# Patient Record
Sex: Male | Born: 1965 | Race: Black or African American | Hispanic: No | Smoking: Former smoker
Health system: Southern US, Community
[De-identification: ages and names within clinical notes are randomized; demographics above are authoritative.]

## PROBLEM LIST (undated history)

## (undated) DIAGNOSIS — N183 Chronic kidney disease, stage 3 unspecified: Secondary | ICD-10-CM

## (undated) DIAGNOSIS — M5416 Radiculopathy, lumbar region: Secondary | ICD-10-CM

## (undated) DIAGNOSIS — F419 Anxiety disorder, unspecified: Secondary | ICD-10-CM

## (undated) DIAGNOSIS — M1990 Unspecified osteoarthritis, unspecified site: Secondary | ICD-10-CM

## (undated) DIAGNOSIS — Z21 Asymptomatic human immunodeficiency virus [HIV] infection status: Secondary | ICD-10-CM

## (undated) DIAGNOSIS — K219 Gastro-esophageal reflux disease without esophagitis: Secondary | ICD-10-CM

## (undated) HISTORY — DX: Unspecified osteoarthritis, unspecified site: M19.90

## (undated) HISTORY — DX: Radiculopathy, lumbar region: M54.16

## (undated) HISTORY — DX: Gastro-esophageal reflux disease without esophagitis: K21.9

## (undated) HISTORY — DX: Chronic kidney disease, stage 3 unspecified: N18.30

## (undated) HISTORY — DX: Anxiety disorder, unspecified: F41.9

---

## 2009-07-16 LAB — HM HEPATITIS C SCREENING LAB: HM Hepatitis Screen: NEGATIVE

## 2013-04-11 DIAGNOSIS — N183 Chronic kidney disease, stage 3 unspecified: Secondary | ICD-10-CM | POA: Insufficient documentation

## 2014-10-29 DIAGNOSIS — R0602 Shortness of breath: Secondary | ICD-10-CM | POA: Insufficient documentation

## 2014-10-29 DIAGNOSIS — R0981 Nasal congestion: Secondary | ICD-10-CM | POA: Insufficient documentation

## 2014-11-18 LAB — HM HIV SCREENING LAB: HM HIV Screening: POSITIVE

## 2014-11-18 LAB — HIV ANTIBODY (ROUTINE TESTING W REFLEX): HIV 1&2 Ab, 4th Generation: POSITIVE

## 2015-05-04 ENCOUNTER — Emergency Department (INDEPENDENT_AMBULATORY_CARE_PROVIDER_SITE_OTHER): Payer: Medicare Other

## 2015-05-04 ENCOUNTER — Encounter: Payer: Self-pay | Admitting: Emergency Medicine

## 2015-05-04 ENCOUNTER — Emergency Department (INDEPENDENT_AMBULATORY_CARE_PROVIDER_SITE_OTHER)
Admission: EM | Admit: 2015-05-04 | Discharge: 2015-05-04 | Disposition: A | Discharge status: Home / Self Care | Payer: Medicare Other | Source: Home / Self Care | Attending: Family Medicine | Admitting: Family Medicine

## 2015-05-04 DIAGNOSIS — M546 Pain in thoracic spine: Secondary | ICD-10-CM | POA: Diagnosis not present

## 2015-05-04 DIAGNOSIS — R918 Other nonspecific abnormal finding of lung field: Secondary | ICD-10-CM

## 2015-05-04 HISTORY — DX: Asymptomatic human immunodeficiency virus (hiv) infection status: Z21

## 2015-05-04 NOTE — ED Provider Notes (Signed)
CSN: [id number]     Arrival date & time 05/04/15  1248 History   First MD Initiated Contact with Patient 05/04/15 1351     Chief Complaint  Patient presents with  . Chest Pain      HPI Comments: Patient complains of two day history of pain in left posterior ribs. He is concerned about possible pneumonia, although he does not have a cough. No chest injury.  No rash. No fevers, chills, and sweats.  Patient is a 50 y.o. male presenting with chest pain. The history is provided by the patient.  Chest Pain Chest pain location: Left posterior ribs. Pain quality: aching   Pain radiates to:  Does not radiate Pain severity:  Mild Onset quality:  Gradual Duration:  2 days Timing:  Constant Progression:  Unchanged Chronicity:  New Context: breathing and movement   Relieved by:  Nothing Worsened by:  Coughing, certain positions and movement Ineffective treatments:  None tried Associated symptoms: back pain and shortness of breath   Associated symptoms: no abdominal pain, no cough, no diaphoresis, no fatigue, no fever and no nausea     Past Medical History  Diagnosis Date  . HIV positive (HCC)    History reviewed. No pertinent past surgical history. Family History  Problem Relation Age of Onset  . Hypertension Mother   . Heart failure Brother   . Hypertension Brother   . Stroke Brother    Social History  Substance Use Topics  . Smoking status: Current Every Day Smoker  . Smokeless tobacco: None  . Alcohol Use: Yes    Review of Systems  Constitutional: Negative for fever, diaphoresis and fatigue.  Respiratory: Positive for shortness of breath. Negative for cough.   Cardiovascular: Positive for chest pain.  Gastrointestinal: Negative for nausea and abdominal pain.  Musculoskeletal: Positive for back pain.    Allergies  Review of patient's allergies indicates no known allergies.  Home Medications   Prior to Admission medications   Medication Sig Start Date End Date Taking?  Authorizing Provider  abacavir-dolutegravir-lamiVUDine (TRIUMEQ) 600-50-300 MG tablet Take 1 tablet by mouth daily.   Yes Historical Provider, MD   Meds Ordered and Administered this Visit  Medications - No data to display  BP 151/84 mmHg  Pulse 73  Temp(Src) 98.1 F (36.7 C) (Oral)  Resp 16  Ht  (1.702 m)  Wt 170 lb (77.111 kg)  BMI 26.62 kg/m2  SpO2 99% No data found.   Physical Exam Nursing notes and Vital Signs reviewed. Appearance:  Patient appears stated age, and in no acute distress Eyes:  Pupils are equal, round, and reactive to light and accomodation.  Extraocular movement is intact.  Conjunctivae are not inflamed  Pharynx:  Normal Neck:  Supple.  No adenopathy Lungs:  Clear to auscultation.  Breath sounds are equal.  Moving air well. Heart:  Regular rate and rhythm without murmurs, rubs, or gallops.  Abdomen:  Nontender without masses or hepatosplenomegaly.  Bowel sounds are present.  No CVA or flank tenderness.  Back:  No tenderness to palpation.  No upper back or scapular tenderness to palpation  Extremities:  No edema.  Left shoulder has full range of motion. Skin:  No rash present.   ED Course  Procedures  None  Imaging Review Dg Chest 2 View  05/04/2015  CLINICAL DATA:  Pt states he has had left sided back pain x 2 days with a cough and intermittent fever. Hx of HIV EXAM: CHEST  2 VIEW COMPARISON:  None. FINDINGS: Normal cardiac silhouette. There is a new 19 mm rounded density superior to the RIGHT hilum. No pulmonary infiltrate. No pleural fluid. No pneumothorax. No aggressive osseous lesion. IMPRESSION: 1. No acute cardiopulmonary findings. 2. Smoothly marginated rounded lesion projecting superior to the RIGHT hilum. This is typical location for the azygos vein however this greater in diameter than typical. Differential would include healed rib fracture versus pulmonary nodule. Without comparison available, recommend non emergent CT thorax without contrast.  Electronically Signed   By: Genevive Bi M.D.   On: 05/04/2015 13:30      MDM   1. Left-sided thoracic back pain; probably musculoskeletal     Recommend taking Ibuprofen , 4 tabs every 8 hours with food, for 3 to 4 days until improved. Note abnormal chest X-ray. Followup with PCP to schedule CT thorax without contrast as recommended by radiologist.    Lattie Haw, MD 05/08/15 [phone]

## 2015-05-04 NOTE — ED Notes (Signed)
Reports about 2 days of pain in posterior left ribs that he thinks may be pneumonia; has been treating himself with otc cold products.

## 2015-05-04 NOTE — Discharge Instructions (Signed)
Recommend taking Ibuprofen , 4 tabs every 8 hours with food, for 3 to 4 days until improved.   Musculoskeletal Pain Musculoskeletal pain is muscle and boney aches and pains. These pains can occur in any part of the body. Your caregiver may treat you without knowing the cause of the pain. They may treat you if blood or urine tests, X-rays, and other tests were normal.  CAUSES There is often not a definite cause or reason for these pains. These pains may be caused by a type of germ (virus). The discomfort may also come from overuse. Overuse includes working out too hard when your body is not fit. Boney aches also come from weather changes. Bone is sensitive to atmospheric pressure changes. HOME CARE INSTRUCTIONS   Ask when your test results will be ready. Make sure you get your test results.  Only take over-the-counter or prescription medicines for pain, discomfort, or fever as directed by your caregiver. If you were given medications for your condition, do not drive, operate machinery or power tools, or sign legal documents for 24 hours. Do not drink alcohol. Do not take sleeping pills or other medications that may interfere with treatment.  Continue all activities unless the activities cause more pain. When the pain lessens, slowly resume normal activities. Gradually increase the intensity and duration of the activities or exercise.  During periods of severe pain, bed rest may be helpful. Lay or sit in any position that is comfortable.  Putting ice on the injured area.  Put ice in a bag.  Place a towel between your skin and the bag.  Leave the ice on for 15 to 20 minutes, 3 to 4 times a day.  Follow up with your caregiver for continued problems and no reason can be found for the pain. If the pain becomes worse or does not go away, it may be necessary to repeat tests or do additional testing. Your caregiver may need to look further for a possible cause. SEEK IMMEDIATE MEDICAL CARE  IF:  You have pain that is getting worse and is not relieved by medications.  You develop chest pain that is associated with shortness or breath, sweating, feeling sick to your stomach (nauseous), or throw up (vomit).  Your pain becomes localized to the abdomen.  You develop any new symptoms that seem different or that concern you. MAKE SURE YOU:   Understand these instructions.  Will watch your condition.  Will get help right away if you are not doing well or get worse.   This information is not intended to replace advice given to you by your health care provider. Make sure you discuss any questions you have with your health care provider.   Document Released: 03/16/2005 Document Revised: 06/08/2011 Document Reviewed: 11/18/2012 Elsevier Interactive Patient Education [employer]

## 2015-05-09 ENCOUNTER — Telehealth: Payer: Self-pay | Admitting: *Deleted

## 2015-05-28 DIAGNOSIS — R300 Dysuria: Secondary | ICD-10-CM | POA: Insufficient documentation

## 2016-02-27 DIAGNOSIS — R7689 Other specified abnormal immunological findings in serum: Secondary | ICD-10-CM | POA: Insufficient documentation

## 2016-02-27 DIAGNOSIS — R768 Other specified abnormal immunological findings in serum: Secondary | ICD-10-CM | POA: Insufficient documentation

## 2018-02-07 ENCOUNTER — Emergency Department (INDEPENDENT_AMBULATORY_CARE_PROVIDER_SITE_OTHER)
Admission: EM | Admit: 2018-02-07 | Discharge: 2018-02-07 | Disposition: A | Discharge status: Home / Self Care | Payer: Medicare Other | Source: Home / Self Care | Attending: Family Medicine | Admitting: Family Medicine

## 2018-02-07 ENCOUNTER — Other Ambulatory Visit: Payer: Self-pay

## 2018-02-07 DIAGNOSIS — R197 Diarrhea, unspecified: Secondary | ICD-10-CM | POA: Diagnosis not present

## 2018-02-07 DIAGNOSIS — R51 Headache: Secondary | ICD-10-CM

## 2018-02-07 DIAGNOSIS — R519 Headache, unspecified: Secondary | ICD-10-CM

## 2018-02-07 LAB — POCT CBC W AUTO DIFF (K'VILLE URGENT CARE)

## 2018-02-07 NOTE — ED Triage Notes (Signed)
Pt has not been feeling well for about 1 week.  Diarrhea all last week, better,but not resolved.  Has had a headache for the last 2-3 days, ibuprofen helps, but not resolved.

## 2018-02-07 NOTE — Discharge Instructions (Signed)
°  Your symptoms are likely due to a stomach virus. It is recommended that you stay well hydrated and get plenty of rest. It is important to replenish your electrolytes from the diarrhea by drinking sports drinks such as Gatorade or drinking Pedialyte.     Avoid fried fatty food, spicy food, and milk as these foods can cause worsening stomach upset.  Bread, rice, apple sauce, and toast are good foods to help your stool bulk up. There are additional food items/dietary recommendations for diarrhea in this packet.  Please follow up with family medicine or your infectious disease doctor in 3-4 days if not improving, sooner if worsening.

## 2018-02-07 NOTE — ED Provider Notes (Signed)
[facility]    CSN: [id number] Arrival date & time: 02/07/18  0804     History   Chief Complaint Chief Complaint  Patient presents with  . Diarrhea  . Headache    HPI Louis Bennett is a 52 y.o. male.   HPI Louis Bennett is a 52 y.o. male presenting to UC with c/o not feeling well for about 1 week with initial body aches, cough, congestion, that has since passed but he has had a week for watery diarrhea as well.  The diarrhea has gradually improved with just 1 episode yesterday and none today. Pt c/o generalized HA for 2-3 days, nagging, mild to moderate in severity. Temporary relief with ibuprofen. Denies fever. No known sick contacts or recent travel. He is unsure of his CD4 count. Medical records show his CD4 count was 0.30 on 11/23/17 and HIV RNA Quant was <20 target not detected copies. He is compliant with his HIV medications and f/u with ID routinely.     Past Medical History:  Diagnosis Date  . HIV positive (HCC)     There are no active problems to display for this patient.   History reviewed. No pertinent surgical history.     Home Medications    Prior to Admission medications   Medication Sig Start Date End Date Taking? Authorizing Provider  abacavir-dolutegravir-lamiVUDine (TRIUMEQ) 600-50-300 MG tablet Take 1 tablet by mouth daily.    [provider]    Family History Family History  Problem Relation Age of Onset  . Hypertension Mother   . Heart failure Brother   . Hypertension Brother   . Stroke Brother     Social History Social History   Tobacco Use  . Smoking status: Current Every Day Smoker  Substance Use Topics  . Alcohol use: Yes  . Drug use: Not on file     Allergies   Patient has no known allergies.   Review of Systems Review of Systems  Constitutional: Negative for chills and fever.  Gastrointestinal: Positive for abdominal pain (mild diffuse cramping), diarrhea, nausea and vomiting. Negative for  blood in stool and constipation.  Musculoskeletal: Negative for arthralgias, back pain and myalgias.  Neurological: Positive for headaches. Negative for dizziness and light-headedness.     Physical Exam Triage Vital Signs ED Triage Vitals  Enc Vitals Group     BP 02/07/18 0823 (!) 143/89     Pulse Rate 02/07/18 0823 70     Resp 02/07/18 0823 18     Temp 02/07/18 0823 98.1 F (36.7 C)     Temp Source 02/07/18 0823 Oral     SpO2 02/07/18 0823 100 %     Weight 02/07/18 0824 175 lb (79.4 kg)     Height 02/07/18 0824 5\' 7"  (1.702 m)     Head Circumference --      Peak Flow --      Pain Score 02/07/18 0824 4     Pain Loc --      Pain Edu? --      Excl. in GC? --    No data found.  Updated Vital Signs BP (!) 143/89 (BP Location: Right Arm)   Pulse 70   Temp 98.1 F (36.7 C) (Oral)   Resp 18   Ht 5\' 7"  (1.702 m)   Wt 175 lb (79.4 kg)   SpO2 100%   BMI 27.41 kg/m   Visual Acuity Right Eye Distance:   Left Eye Distance:   Bilateral Distance:  Right Eye Near:   Left Eye Near:    Bilateral Near:     Physical Exam  Constitutional: He is oriented to person, place, and time. He appears well-developed and well-nourished.  Non-toxic appearance. He does not appear ill. No distress.  HENT:  Head: Normocephalic and atraumatic.  Right Ear: Tympanic membrane normal.  Left Ear: Tympanic membrane normal.  Nose: Nose normal. Right sinus exhibits no maxillary sinus tenderness and no frontal sinus tenderness. Left sinus exhibits no maxillary sinus tenderness and no frontal sinus tenderness.  Mouth/Throat: Uvula is midline, oropharynx is clear and moist and mucous membranes are normal.  Eyes: Pupils are equal, round, and reactive to light. EOM are normal.  Neck: Normal range of motion. Neck supple.  Cardiovascular: Normal rate and regular rhythm.  Pulmonary/Chest: Effort normal and breath sounds normal. No stridor. No respiratory distress. He has no wheezes.  Abdominal: Soft. He  exhibits no distension. There is no tenderness.  Musculoskeletal: Normal range of motion.  Neurological: He is alert and oriented to person, place, and time. GCS eye subscore is 4. GCS verbal subscore is 5. GCS motor subscore is 6.  Skin: Skin is warm and dry. Capillary refill takes less than 2 seconds.  Psychiatric: He has a normal mood and affect. His behavior is normal.  Nursing note and vitals reviewed.    UC Treatments / Results  Labs (all labs ordered are listed, but only abnormal results are displayed) Labs Reviewed  COMPLETE METABOLIC PANEL WITH GFR  POCT CBC W AUTO DIFF ([facility])    EKG None  Radiology No results found.  Procedures Procedures (including critical care time)  Medications Ordered in UC Medications - No data to display  Initial Impression / Assessment and Plan / UC Course  I have reviewed the triage vital signs and the nursing notes.  Pertinent labs & imaging results that were available during my care of the patient were reviewed by me and considered in my medical decision making (see chart for details).     Normal exam. Hx c/w viral illness Encouraged increased fluids and rest, bland diet. CBC: unremarkable CMP: pending Encouraged f/u with PCP or ID later this week if not improving.  Final Clinical Impressions(s) / UC Diagnoses   Final diagnoses:  Diarrhea, unspecified type  Generalized headache     Discharge Instructions      Your symptoms are likely due to a stomach virus. It is recommended that you stay well hydrated and get plenty of rest. It is important to replenish your electrolytes from the diarrhea by drinking sports drinks such as Gatorade or drinking Pedialyte.     Avoid fried fatty food, spicy food, and milk as these foods can cause worsening stomach upset.  Bread, rice, apple sauce, and toast are good foods to help your stool bulk up. There are additional food items/dietary recommendations for diarrhea in this  packet.  Please follow up with family medicine or your infectious disease doctor in 3-4 days if not improving, sooner if worsening.      ED Prescriptions    None     Controlled Substance Prescriptions [place] Controlled Substance Registry consulted? Not Applicable   Lurene Shadow, PA-C 02/07/18 1011

## 2018-02-08 ENCOUNTER — Telehealth: Payer: Self-pay

## 2018-02-08 LAB — COMPLETE METABOLIC PANEL WITH GFR
AG Ratio: 1.8 (calc) (ref 1.0–2.5)
ALT: 28 U/L (ref 9–46)
AST: 19 U/L (ref 10–35)
Albumin: 4.4 g/dL (ref 3.6–5.1)
Alkaline phosphatase (APISO): 46 U/L (ref 40–115)
BUN: 14 mg/dL (ref 7–25)
CO2: 28 mmol/L (ref 20–32)
Calcium: 9.3 mg/dL (ref 8.6–10.3)
Chloride: 105 mmol/L (ref 98–110)
Creat: 1.2 mg/dL (ref 0.70–1.33)
GFR, Est African American: 79 mL/min/{1.73_m2} (ref >=60)
GFR, Est Non African American: 68 mL/min/{1.73_m2} (ref >=60)
Globulin: 2.4 g/dL (calc) (ref 1.9–3.7)
Glucose, Bld: 102 mg/dL — ABNORMAL HIGH (ref 65–99)
Potassium: 4 mmol/L (ref 3.5–5.3)
Sodium: 141 mmol/L (ref 135–146)
Total Bilirubin: 0.4 mg/dL (ref 0.2–1.2)
Total Protein: 6.8 g/dL (ref 6.1–8.1)

## 2018-02-08 NOTE — Telephone Encounter (Signed)
Feeling better, lab results given, and contact information given.

## 2018-03-14 DIAGNOSIS — R12 Heartburn: Secondary | ICD-10-CM | POA: Insufficient documentation

## 2019-09-28 ENCOUNTER — Ambulatory Visit: Payer: Medicare Other | Admitting: Family Medicine

## 2019-09-28 ENCOUNTER — Ambulatory Visit (INDEPENDENT_AMBULATORY_CARE_PROVIDER_SITE_OTHER): Payer: Medicare Other | Admitting: Medical-Surgical

## 2019-09-28 ENCOUNTER — Encounter: Payer: Self-pay | Admitting: Medical-Surgical

## 2019-09-28 VITALS — BP 120/85 | HR 78 | Temp 98.3°F | Ht 67.0 in | Wt 176.7 lb

## 2019-09-28 DIAGNOSIS — Z7689 Persons encountering health services in other specified circumstances: Secondary | ICD-10-CM

## 2019-09-28 DIAGNOSIS — B20 Human immunodeficiency virus [HIV] disease: Secondary | ICD-10-CM | POA: Insufficient documentation

## 2019-09-28 DIAGNOSIS — N183 Chronic kidney disease, stage 3 unspecified: Secondary | ICD-10-CM

## 2019-09-28 DIAGNOSIS — E785 Hyperlipidemia, unspecified: Secondary | ICD-10-CM | POA: Diagnosis not present

## 2019-09-28 DIAGNOSIS — Z21 Asymptomatic human immunodeficiency virus [HIV] infection status: Secondary | ICD-10-CM | POA: Insufficient documentation

## 2019-09-28 NOTE — Progress Notes (Signed)
New Patient Office Visit  Subjective:  Patient ID: Louis Bennett, male    DOB: 12/17/1965  Age: 54 y.o. MRN: [id number]  CC:  Chief Complaint  Patient presents with  . Establish Care    HPI Louis Bennett is a pleasant 54 year old male who presents today to establish care.  Up until recently, he has sought most of his care with infectious disease but he reports that he was instructed by his providers there that he needs to establish a PCP.  His chronic health conditions include HIV positive, hyperlipidemia, CKD stage III, and HSV-2 seropositive.  His HIV is stable and is managed and monitored by infectious disease.  He is not currently on any cholesterol medicines but he reports trying to live a healthy life and exercising regularly.  He has no concerns today.  Past Medical History:  Diagnosis Date  . HIV positive (HCC)     History reviewed. No pertinent surgical history.  Family History  Problem Relation Age of Onset  . Hypertension Mother   . Diabetes Mother   . Heart failure Brother   . Hypertension Brother   . Stroke Brother     Social History   Socioeconomic History  . Marital status: Single    Spouse name: Not on file  . Number of children: Not on file  . Years of education: Not on file  . Highest education level: Not on file  Occupational History  . Not on file  Tobacco Use  . Smoking status: Current Some Day Smoker    Types: Cigarettes  . Smokeless tobacco: Never Used  Vaping Use  . Vaping Use: Some days  . Substances: Nicotine  Substance and Sexual Activity  . Alcohol use: Yes    Alcohol/week: 12.0 standard drinks    Types: 12 Cans of beer per week    Comment: only drinks on weekends  . Drug use: Never  . Sexual activity: Yes    Partners: Female    Birth control/protection: Condom  Other Topics Concern  . Not on file  Social History Narrative  . Not on file   Social Determinants of Health   Financial Resource Strain:   . Difficulty of  Paying Living Expenses:   Food Insecurity:   . Worried About Programme researcher, broadcasting/film/video in the Last Year:   . Barista in the Last Year:   Transportation Needs:   . Freight forwarder (Medical):   Marland Kitchen Lack of Transportation (Non-Medical):   Physical Activity:   . Days of Exercise per Week:   . Minutes of Exercise per Session:   Stress:   . Feeling of Stress :   Social Connections:   . Frequency of Communication with Friends and Family:   . Frequency of Social Gatherings with Friends and Family:   . Attends Religious Services:   . Active Member of Clubs or Organizations:   . Attends Banker Meetings:   Marland Kitchen Marital Status:   Intimate Partner Violence:   . Fear of Current or Ex-Partner:   . Emotionally Abused:   Marland Kitchen Physically Abused:   . Sexually Abused:     ROS Review of Systems  Constitutional: Negative for chills, fatigue, fever and unexpected weight change.  HENT: Negative for congestion, sneezing and sore throat.   Eyes: Negative for visual disturbance.  Respiratory: Negative for cough, chest tightness, shortness of breath and wheezing.   Cardiovascular: Negative for chest pain, palpitations and leg swelling.  Gastrointestinal: Negative for abdominal  pain, nausea and vomiting.  Genitourinary: Negative for dysuria.  Neurological: Negative for dizziness, light-headedness and headaches.  Psychiatric/Behavioral: Negative for dysphoric mood, self-injury, sleep disturbance and suicidal ideas. The patient is not nervous/anxious.     Objective:   Today's Vitals: BP 120/85   Pulse 78   Temp 98.3 F (36.8 C) (Oral)   Ht 5\' 7"  (1.702 m)   Wt 176 lb 11.2 oz (80.2 kg)   SpO2 96%   BMI 27.68 kg/m   Physical Exam Vitals reviewed.  Constitutional:      General: He is not in acute distress.    Appearance: Normal appearance. He is normal weight.  HENT:     Head: Normocephalic and atraumatic.  Cardiovascular:     Rate and Rhythm: Normal rate and regular rhythm.      Pulses: Normal pulses.     Heart sounds: Normal heart sounds. No murmur heard.  No friction rub. No gallop.   Pulmonary:     Effort: Pulmonary effort is normal. No respiratory distress.     Breath sounds: Normal breath sounds. No wheezing.  Skin:    General: Skin is warm and dry.  Neurological:     Mental Status: He is alert and oriented to person, place, and time.  Psychiatric:        Mood and Affect: Mood normal.        Behavior: Behavior normal.        Thought Content: Thought content normal.        Judgment: Judgment normal.     Assessment & Plan:   1. Human immunodeficiency virus (HIV) disease (HCC) Managed by infectious disease at [facility].  2. Stage 3 chronic kidney disease, unspecified whether stage 3a or 3b CKD Per last check, GFR at 61.  Stable.  3. Hyperlipidemia, unspecified hyperlipidemia type Continue lifestyle modifications to include low fat heart healthy diet and regular exercise.  Last lipids checked in December 2019 but he has blood drawn every 6 months with infectious disease.  He would like to wait to see if they will draw a lipid panel for him at his next blood draw.  4.  Encounter to establish care  Reviewed records available in care everywhere.  Discussed medical concerns and history with patient.  He is due for colonoscopy but for now would like to postpone.    Outpatient Encounter Medications as of 09/28/2019  Medication Sig  . abacavir-dolutegravir-lamiVUDine (TRIUMEQ) 600-50-300 MG tablet Take 1 tablet by mouth daily.   No facility-administered encounter medications on file as of 09/28/2019.    Follow-up: Return in about 1 year (around 09/27/2020) for annual physical exam or sooner if needed.   11/28/2020, DNP, APRN, FNP-BC [facility] and Sports Medicine

## 2019-12-12 ENCOUNTER — Other Ambulatory Visit: Payer: Self-pay

## 2019-12-12 ENCOUNTER — Ambulatory Visit (INDEPENDENT_AMBULATORY_CARE_PROVIDER_SITE_OTHER): Payer: Medicare Other | Admitting: Sports Medicine

## 2019-12-12 ENCOUNTER — Ambulatory Visit (INDEPENDENT_AMBULATORY_CARE_PROVIDER_SITE_OTHER): Payer: Medicare Other

## 2019-12-12 DIAGNOSIS — M5416 Radiculopathy, lumbar region: Secondary | ICD-10-CM

## 2019-12-12 DIAGNOSIS — R0602 Shortness of breath: Secondary | ICD-10-CM

## 2019-12-12 LAB — CBC
HCT: 39.8 % (ref 38.5–50.0)
Hemoglobin: 13.4 g/dL (ref 13.2–17.1)
MCH: 31.2 pg (ref 27.0–33.0)
MCHC: 33.7 g/dL (ref 32.0–36.0)
MCV: 92.6 fL (ref 80.0–100.0)
MPV: 9.7 fL (ref 7.5–12.5)
Platelets: 216 10*3/uL (ref 140–400)
RBC: 4.3 10*6/uL (ref 4.20–5.80)
RDW: 13 % (ref 11.0–15.0)
WBC: 4.3 10*3/uL (ref 3.8–10.8)

## 2019-12-12 LAB — COMPLETE METABOLIC PANEL WITH GFR
AG Ratio: 1.7 (calc) (ref 1.0–2.5)
ALT: 29 U/L (ref 9–46)
AST: 20 U/L (ref 10–35)
Albumin: 4.5 g/dL (ref 3.6–5.1)
Alkaline phosphatase (APISO): 44 U/L (ref 35–144)
BUN/Creatinine Ratio: 18 (calc) (ref 6–22)
BUN: 25 mg/dL (ref 7–25)
CO2: 25 mmol/L (ref 20–32)
Calcium: 9.2 mg/dL (ref 8.6–10.3)
Chloride: 107 mmol/L (ref 98–110)
Creat: 1.4 mg/dL — ABNORMAL HIGH (ref 0.70–1.33)
GFR, Est African American: 65 mL/min/{1.73_m2} (ref >=60)
GFR, Est Non African American: 56 mL/min/{1.73_m2} — ABNORMAL LOW (ref >=60)
Globulin: 2.6 g/dL (calc) (ref 1.9–3.7)
Glucose, Bld: 89 mg/dL (ref 65–139)
Potassium: 4.5 mmol/L (ref 3.5–5.3)
Sodium: 140 mmol/L (ref 135–146)
Total Bilirubin: 0.4 mg/dL (ref 0.2–1.2)
Total Protein: 7.1 g/dL (ref 6.1–8.1)

## 2019-12-12 LAB — D-DIMER, QUANTITATIVE: D-Dimer, Quant: 0.23 mcg/mL FEU (ref <0.50)

## 2019-12-12 MED ORDER — MELOXICAM 15 MG PO TABS: ORAL_TABLET | ORAL | 3 refills | Status: DC

## 2019-12-12 NOTE — Assessment & Plan Note (Signed)
Louis Bennett is also, adding of some shortness of breath with deep breathing, due to pleuritic type chest pain I am going to add a D-dimer, CBC, CMP, chest x-ray. If nothing acute then he can follow this up with his PCP. He does currently smoke about 2 cigarettes a day.

## 2019-12-12 NOTE — Assessment & Plan Note (Signed)
This is a pleasant 54 year old male, for the past several weeks he has had pain in his low back with radiation down the right leg, anterolateral aspect. He was seen in the ED, no imaging done, he was given meloxicam which seem to be helpful. No progressive weakness, bowel or bladder function of saddle numbness, constitutional symptoms. We will start with formal physical therapy, x-rays, refilling meloxicam. Return to see me in 4 to 6 weeks, MRI for interventional planning if no better.

## 2019-12-12 NOTE — Progress Notes (Signed)
    Procedures performed today:    None.  Independent interpretation of notes and tests performed by another provider:   None.  Brief History, Exam, Impression, and Recommendations:    Louis Bennett is a pleasant 54yo male who presents today with bilateral lower back pain. It has been going on for about 3 weeks. He said it started after working out. He has pain and tingling that radiates down the right leg mainly. He has no weakness in his legs or loss of bowel/bladder function. He was seen in the ED a week ago and was given meloxicam which helped. This is potentially a herniated disk in his back. We are going to get a lumbar X-ray and prescribe PT and meloxicam for pain managament. He can follow up in 4-6 weeks for reevaulaiton.   He also complains of some SOB that has been ongoing for a month. He smokes a couple cigarettes a day. On exam no aortic widening was appreciated. We are going to get a D-dimer today to r/o clot. We are also going to get  A chest xray today. He can follow up in 4-6 weeks for reevaluation.  Aurelio Jew, MS3   ___________________________________________ Louis Bennett. Benjamin Stain, M.D., ABFM., CAQSM. Primary Care and Sports Medicine [facility]  Adjunct Instructor of Family Medicine  [employer]

## 2019-12-13 ENCOUNTER — Telehealth: Payer: Self-pay

## 2019-12-13 DIAGNOSIS — R911 Solitary pulmonary nodule: Secondary | ICD-10-CM

## 2019-12-13 DIAGNOSIS — F17210 Nicotine dependence, cigarettes, uncomplicated: Secondary | ICD-10-CM

## 2019-12-13 NOTE — Telephone Encounter (Signed)
Received call report on patients CXR done today.   Reported printed and given to Dr Ashley Royalty, OK given to send note to PCP to further address tomorrow.   Joy, please see report in chart

## 2019-12-14 NOTE — Telephone Encounter (Signed)
Task completed. Left a detailed vm msg for patient regarding CT Scan. Imaging contact info provided to patient. Direct call back info provided.

## 2019-12-14 NOTE — Telephone Encounter (Signed)
Pt aware of results, CT order, and that Imaging will be calling him for scheduling. No further questions or concerns at this time.

## 2019-12-14 NOTE — Telephone Encounter (Signed)
Low dose noncontrast CT ordered for further evaluation of lung nodule seen on CXR yesterday. Please contact patient to let him know they will reach out to him to get scheduled.

## 2019-12-21 ENCOUNTER — Ambulatory Visit (INDEPENDENT_AMBULATORY_CARE_PROVIDER_SITE_OTHER): Payer: Medicare Other

## 2019-12-21 ENCOUNTER — Other Ambulatory Visit: Payer: Self-pay

## 2019-12-21 DIAGNOSIS — R911 Solitary pulmonary nodule: Secondary | ICD-10-CM | POA: Diagnosis not present

## 2019-12-21 DIAGNOSIS — F17210 Nicotine dependence, cigarettes, uncomplicated: Secondary | ICD-10-CM

## 2020-01-11 ENCOUNTER — Encounter: Payer: Self-pay | Admitting: Medical-Surgical

## 2020-01-11 ENCOUNTER — Ambulatory Visit (INDEPENDENT_AMBULATORY_CARE_PROVIDER_SITE_OTHER): Payer: Medicare Other

## 2020-01-11 ENCOUNTER — Other Ambulatory Visit: Payer: Self-pay

## 2020-01-11 ENCOUNTER — Ambulatory Visit (INDEPENDENT_AMBULATORY_CARE_PROVIDER_SITE_OTHER): Payer: Medicare Other | Admitting: Medical-Surgical

## 2020-01-11 VITALS — BP 132/82 | HR 85 | Temp 97.8°F | Ht 67.0 in | Wt 182.6 lb

## 2020-01-11 DIAGNOSIS — R0600 Dyspnea, unspecified: Secondary | ICD-10-CM

## 2020-01-11 DIAGNOSIS — Z23 Encounter for immunization: Secondary | ICD-10-CM | POA: Diagnosis not present

## 2020-01-11 DIAGNOSIS — R6889 Other general symptoms and signs: Secondary | ICD-10-CM

## 2020-01-11 DIAGNOSIS — R5383 Other fatigue: Secondary | ICD-10-CM

## 2020-01-11 DIAGNOSIS — R0609 Other forms of dyspnea: Secondary | ICD-10-CM

## 2020-01-11 DIAGNOSIS — R002 Palpitations: Secondary | ICD-10-CM

## 2020-01-11 DIAGNOSIS — B20 Human immunodeficiency virus [HIV] disease: Secondary | ICD-10-CM

## 2020-01-11 LAB — CBC WITH DIFFERENTIAL/PLATELET
Basophils Relative: 0.3 %
Eosinophils Relative: 3.7 %
Lymphs Abs: 967 cells/uL (ref 850–3900)
MCH: 31.1 pg (ref 27.0–33.0)
MCHC: 34.2 g/dL (ref 32.0–36.0)
MCV: 90.9 fL (ref 80.0–100.0)
Neutro Abs: 1940 cells/uL (ref 1500–7800)
Platelets: 231 10*3/uL (ref 140–400)
RBC: 4.6 10*6/uL (ref 4.20–5.80)
RDW: 12.6 % (ref 11.0–15.0)
WBC: 3.3 10*3/uL — ABNORMAL LOW (ref 3.8–10.8)

## 2020-01-11 NOTE — Progress Notes (Signed)
Subjective:    CC: Headache, fatigue  HPI: Pleasant 55 year old HIV-positive male presenting today with complaints of generalized malaise, fatigue, and headache over the past 3 months.  His complaints are nonspecific but he feels as if his immune system is shutting down and is if there is something wrong in his body.  He is taking Triumeq as prescribed.  He is followed by infectious disease every 6 months and was last seen at the end of April with them.  They are checking his HIV and CD4 counts regularly.  His last levels were undetectable and his CD4 counts were okay.  He normally exercises several days every week but lately has been so fatigued that he does not have the energy to do so.  Endorses that he does occasionally get mildly short of breath after climbing stairs.  When lying down, he feels his pulse in the top of his head and along his forehead.  At times feels like his heart slows way down and other times it feels like his heart skips a beat.  Gets dizzy at times with positional changes.  Admits that he does not drink much water.  Notes that he does sleep well but wakes still feeling tired and has been excessively sleepy throughout the day.  He does snore intermittently but has no documented history of sleep apnea and he has not been told that he stops breathing in his sleep.  Has never had a sleep study.  Reports intermittent numbness and tingling that has affected mostly his left upper extremity but also his right lower extremity.  Denies muscle tenderness but feels like his muscles are weak.  Denies fever, chills, shortness of breath at rest, chest pain, wheezing, coughing, abdominal pain or nausea/vomiting.  STOP-BANG for SLEEP APNEA Do you Snore loudly? Yes Do you often feel Tired during day? Yes Has anyone Observed you stop breathing? No History of high blood Pressure? No BMI >35? No Age >50? Yes Neck circumference >16 in? No Gender male? Yes 5-8 = high risk 3-4 =  intermediate 0-2 = low risk   I reviewed the past medical history, family history, social history, surgical history, and allergies today and no changes were needed.  Please see the problem list section below in epic for further details.  Past Medical History: Past Medical History:  Diagnosis Date  . HIV positive (HCC)    Past Surgical History: History reviewed. No pertinent surgical history. Social History: Social History   Socioeconomic History  . Marital status: Single    Spouse name: Not on file  . Number of children: Not on file  . Years of education: Not on file  . Highest education level: Not on file  Occupational History  . Not on file  Tobacco Use  . Smoking status: Current Some Day Smoker    Types: Cigarettes  . Smokeless tobacco: Never Used  Vaping Use  . Vaping Use: Some days  . Substances: Nicotine  Substance and Sexual Activity  . Alcohol use: Yes    Alcohol/week: 12.0 standard drinks    Types: 12 Cans of beer per week    Comment: only drinks on weekends  . Drug use: Never  . Sexual activity: Yes    Partners: Female    Birth control/protection: Condom  Other Topics Concern  . Not on file  Social History Narrative  . Not on file   Social Determinants of Health   Financial Resource Strain:   . Difficulty of Paying Living Expenses: Not  on file  Food Insecurity:   . Worried About Programme researcher, broadcasting/film/video in the Last Year: Not on file  . Ran Out of Food in the Last Year: Not on file  Transportation Needs:   . Lack of Transportation (Medical): Not on file  . Lack of Transportation (Non-Medical): Not on file  Physical Activity:   . Days of Exercise per Week: Not on file  . Minutes of Exercise per Session: Not on file  Stress:   . Feeling of Stress : Not on file  Social Connections:   . Frequency of Communication with Friends and Family: Not on file  . Frequency of Social Gatherings with Friends and Family: Not on file  . Attends Religious Services: Not  on file  . Active Member of Clubs or Organizations: Not on file  . Attends Banker Meetings: Not on file  . Marital Status: Not on file   Family History: Family History  Problem Relation Age of Onset  . Hypertension Mother   . Diabetes Mother   . Heart failure Brother   . Hypertension Brother   . Stroke Brother    Allergies: No Known Allergies Medications: See med rec.  Review of Systems: See HPI for pertinent positives and negatives.   Objective:    General: Well Developed, well nourished, and in no acute distress.  Neuro: Alert and oriented x3.  HEENT: Normocephalic, atraumatic, pupils equal round reactive to light, neck supple, no masses, no lymphadenopathy, thyroid nonpalpable.  Mild erythema to bilateral external ear canals (pt uses Q-tips to clean them) but normal TMs.  Dizziness provoked with rising from lying to sitting, resolved after approximately 30 seconds Skin: Warm and dry. Cardiac: Regular rate and rhythm, no murmurs rubs or gallops, no lower extremity edema.  Respiratory: Clear to auscultation bilaterally. Not using accessory muscles, speaking in full sentences.   Impression and Recommendations:    1. Fatigue, unspecified type This generalized fatigue and general feeling of unwellness has been present for several months.  Creatinine slightly elevated last month.  We will check CBC with differential, CMP, CD4 count, HIV ultra quant, CK, TSH, vitamin B12, and testosterone today.  Suspect there may be a bit of dehydration contributing to his symptoms so recommend drinking no less than 64 ounces of water every day.  2. Palpitation With his intermittent sensations of bradycardia and possible skipped beats, getting twelve-lead EKG.  In office EKG completed without difficulty with normal rate of 72, normal rhythm, and normal axis.  No acute changes. - EKG 12-Lead  3. DOE (dyspnea on exertion) EKG showing normal sinus rhythm with no acute changes.  Chest  x-ray ordered. - EKG 12-Lead - DG Chest 2 View; Future  4. Need for influenza vaccination Flu vaccine given in office today. - Flu Vaccine QUAD 36+ mos IM  5. Human immunodeficiency virus (HIV) disease (HCC)  This is normally followed by his infectious disease doctor but he reports his next appointment is in January.  With his current symptoms, we will go ahead and check an HIV ultra quant and CD4 count.  Advised patient to contact his infectious disease office and verify his next appointment as they should be following him every 6 months. - HIV-1 RNA ultraquant reflex to gentyp+ - T-helper cells (CD4) count  6. Other general symptoms and signs  Having intermittent numbness and tingling in his extremities so we will go ahead and check vitamin B12 level. - Vitamin B12  Return for follow up pending  lab and imaging results. ___________________________________________ Thayer Ohm, DNP, APRN, FNP-BC [facility]

## 2020-01-11 NOTE — Patient Instructions (Signed)

## 2020-01-12 LAB — COMPLETE METABOLIC PANEL WITH GFR
Chloride: 103 mmol/L (ref 98–110)
Glucose, Bld: 96 mg/dL (ref 65–99)
Total Bilirubin: 0.4 mg/dL (ref 0.2–1.2)

## 2020-01-13 LAB — VITAMIN B12: Vitamin B-12: 471 pg/mL (ref 200–1100)

## 2020-01-13 LAB — T-HELPER CELLS (CD4) COUNT (NOT AT ARMC)
Absolute CD4: 380 cells/uL — ABNORMAL LOW (ref 490–1740)
CD4 T Helper %: 40 % (ref 30–61)
Total lymphocyte count: 940 cells/uL (ref 850–3900)

## 2020-01-13 LAB — HIV-1 RNA ULTRAQUANT REFLEX TO GENTYP+
HIV 1 RNA Quant: <20 Copies/mL — ABNORMAL HIGH
HIV-1 RNA Quant, Log: <1.3 Log cps/mL — ABNORMAL HIGH

## 2020-01-13 LAB — COMPLETE METABOLIC PANEL WITH GFR
AG Ratio: 1.6 (calc) (ref 1.0–2.5)
ALT: 29 U/L (ref 9–46)
AST: 20 U/L (ref 10–35)
Albumin: 4.5 g/dL (ref 3.6–5.1)
Alkaline phosphatase (APISO): 48 U/L (ref 35–144)
BUN: 25 mg/dL (ref 7–25)
CO2: 28 mmol/L (ref 20–32)
Calcium: 9.5 mg/dL (ref 8.6–10.3)
Creat: 1.33 mg/dL (ref 0.70–1.33)
GFR, Est African American: 69 mL/min/{1.73_m2} (ref >=60)
GFR, Est Non African American: 59 mL/min/{1.73_m2} — ABNORMAL LOW (ref >=60)
Globulin: 2.8 g/dL (calc) (ref 1.9–3.7)
Potassium: 4.5 mmol/L (ref 3.5–5.3)
Sodium: 139 mmol/L (ref 135–146)
Total Protein: 7.3 g/dL (ref 6.1–8.1)

## 2020-01-13 LAB — TESTOSTERONE: Testosterone: 365 ng/dL (ref 250–827)

## 2020-01-13 LAB — CBC WITH DIFFERENTIAL/PLATELET
Absolute Monocytes: 261 cells/uL (ref 200–950)
Basophils Absolute: 10 cells/uL (ref 0–200)
Eosinophils Absolute: 122 cells/uL (ref 15–500)
HCT: 41.8 % (ref 38.5–50.0)
Hemoglobin: 14.3 g/dL (ref 13.2–17.1)
MPV: 9.8 fL (ref 7.5–12.5)
Monocytes Relative: 7.9 %
Neutrophils Relative %: 58.8 %
Total Lymphocyte: 29.3 %

## 2020-01-13 LAB — CK: Total CK: 145 U/L (ref 44–196)

## 2020-01-13 LAB — TSH: TSH: 0.96 mIU/L (ref 0.40–4.50)

## 2020-03-27 DIAGNOSIS — J1282 Pneumonia due to coronavirus disease 2019: Secondary | ICD-10-CM

## 2020-03-27 DIAGNOSIS — U071 COVID-19: Secondary | ICD-10-CM

## 2020-03-27 HISTORY — DX: Pneumonia due to coronavirus disease 2019: J12.82

## 2020-03-27 HISTORY — DX: COVID-19: U07.1

## 2020-03-29 DIAGNOSIS — J9601 Acute respiratory failure with hypoxia: Secondary | ICD-10-CM | POA: Insufficient documentation

## 2020-03-29 HISTORY — DX: Acute respiratory failure with hypoxia: J96.01

## 2020-04-18 DIAGNOSIS — B59 Pneumocystosis: Secondary | ICD-10-CM | POA: Insufficient documentation

## 2020-04-19 ENCOUNTER — Ambulatory Visit (INDEPENDENT_AMBULATORY_CARE_PROVIDER_SITE_OTHER): Payer: Medicare Other

## 2020-04-19 ENCOUNTER — Encounter: Payer: Self-pay | Admitting: Medical-Surgical

## 2020-04-19 ENCOUNTER — Telehealth (INDEPENDENT_AMBULATORY_CARE_PROVIDER_SITE_OTHER): Payer: Medicare Other | Admitting: Medical-Surgical

## 2020-04-19 ENCOUNTER — Other Ambulatory Visit: Payer: Self-pay | Admitting: Interventional Radiology

## 2020-04-19 ENCOUNTER — Other Ambulatory Visit: Payer: Self-pay

## 2020-04-19 DIAGNOSIS — J1282 Pneumonia due to coronavirus disease 2019: Secondary | ICD-10-CM

## 2020-04-19 DIAGNOSIS — Z09 Encounter for follow-up examination after completed treatment for conditions other than malignant neoplasm: Secondary | ICD-10-CM

## 2020-04-19 DIAGNOSIS — U071 COVID-19: Secondary | ICD-10-CM

## 2020-04-19 NOTE — Progress Notes (Signed)
Virtual Visit via Video Note  I connected with Abhishek Levesque on 04/19/20 at  8:30 AM EST by a video enabled telemedicine application and verified that I am speaking with the correct person using two identifiers.   I discussed the limitations of evaluation and management by telemedicine and the availability of in person appointments. The patient expressed understanding and agreed to proceed.  Patient location: home Provider locations: office  Subjective:    CC: Hospital follow-up for COVID pneumonia  HPI: Pleasant 55 year old male presenting via MyChart video visit for hospital follow-up following an admission from 03/27/2020 to 04/12/2020 for COVID pneumonia. He did spend some time in ICU but progressed well through the illness and discharged home on home oxygen on 04/13/2019. Today he is doing well overall. He does admit to feeling sluggish in the morning but once he gets up and moves around he starts to feel better. He is using oxygen at 1.5 L/min via nasal cannula. At time of appointment, his oxygen saturations are 97%. He does remove oxygen for up to an hour at a time a couple times a day and notes that his oxygen levels have stayed above 93%. He did have some trouble sleeping in the hospital and shortly after discharge but this seems to be getting better now. He is eating and drinking without difficulty. During his hospitalization he had some foot pain but this is responsive to ibuprofen and has gotten much better. Denies fever, shortness of breath, chest pain, cough, significant fatigue.   Past medical history, Surgical history, Family history not pertinant except as noted below, Social history, Allergies, and medications have been entered into the medical record, reviewed, and corrections made.   Review of Systems: See HPI for pertinent positives and negatives.   Objective:    General: Speaking clearly in complete sentences without any shortness of breath.  Alert and oriented x3.   Normal judgment. No apparent acute distress.  Impression and Recommendations:    1. Hospital discharge follow-up/COVID pneumonia Doing well overall and appears to be stable following COVID pneumonia. Working on weaning off of oxygen. Okay to go longer periods without oxygen supplementation as long as sats remain above 93%. Slowly wean down as tolerated. Advised patient to contact me should he experience a worsening of symptoms or have any unforeseen needs. He is 1 week out from his discharge so we will go ahead and repeat a chest x-ray. We will plan to follow-up in 3 to 4 weeks to make sure he is doing well on his respiratory status and evaluate his oxygen needs at that time.  I discussed the assessment and treatment plan with the patient. The patient was provided an opportunity to ask questions and all were answered. The patient agreed with the plan and demonstrated an understanding of the instructions.   The patient was advised to call back or seek an in-person evaluation if the symptoms worsen or if the condition fails to improve as anticipated.  20 minutes of non-face-to-face time was provided during this encounter.  Return for COVID pneumonia/oxygen status follow up in 3-4 weeks (virtual is fine).  Thayer Ohm, DNP, APRN, FNP-BC [facility] and Sports Medicine

## 2020-05-10 ENCOUNTER — Encounter: Payer: Self-pay | Admitting: Medical-Surgical

## 2020-05-10 ENCOUNTER — Telehealth (INDEPENDENT_AMBULATORY_CARE_PROVIDER_SITE_OTHER): Payer: Medicare Other | Admitting: Medical-Surgical

## 2020-05-10 DIAGNOSIS — U071 COVID-19: Secondary | ICD-10-CM

## 2020-05-10 DIAGNOSIS — J1282 Pneumonia due to coronavirus disease 2019: Secondary | ICD-10-CM

## 2020-05-10 NOTE — Progress Notes (Signed)
Virtual Visit via Video Note  I connected with Louis Bennett on 05/10/20 at  9:40 AM EST by a video enabled telemedicine application and verified that I am speaking with the correct person using two identifiers.   I discussed the limitations of evaluation and management by telemedicine and the availability of in person appointments. The patient expressed understanding and agreed to proceed.  Patient location: home Provider locations: office  Subjective:    CC: Covid pneumonia follow-up  HPI: Pleasant 55 year old male presenting today via MyChart video visit for follow-up on COVID pneumonia.  He was hospitalized for approximately 2 weeks at the end of December into January.  He was discharged on 04/12/2020 with home oxygen and seen for a hospital discharge visit 1 week later.  Today, he reports he is doing very well overall.  He feels better and has returned to exercising regularly.  He has been able to wean off his home oxygen during the day and has only been using it at night.  He notes that last night he slept without oxygen and did very well.  He continues to use his nasal spray twice daily for nasal dryness.  He does still have some small blood clots coming out of his nose but overall this has improved.  He has already received 1 Covid vaccine and has his next one scheduled in 2 to 3 weeks.  No lingering symptoms although he does note when his exercise intensity increases, he does have to take more frequent rest breaks.  Past medical history, Surgical history, Family history not pertinant except as noted below, Social history, Allergies, and medications have been entered into the medical record, reviewed, and corrections made.   Review of Systems: See HPI for pertinent positives and negatives.   Objective:    General: Speaking clearly in complete sentences without any shortness of breath.  Alert and oriented x3.  Normal judgment. No apparent acute distress.  Impression and  Recommendations:    1. Pneumonia due to COVID-19 virus Resolved.  Is doing much better and continues to improve.  Continue weaning oxygen but okay to use as needed.  Continue nasal spray twice daily to help with nasal dryness.  Plan to get his second Covid vaccine as scheduled.  I discussed the assessment and treatment plan with the patient. The patient was provided an opportunity to ask questions and all were answered. The patient agreed with the plan and demonstrated an understanding of the instructions.   The patient was advised to call back or seek an in-person evaluation if the symptoms worsen or if the condition fails to improve as anticipated.  15 minutes of non-face-to-face time was provided during this encounter.  Return in about 6 months (around 11/07/2020) for CKD/HLD follow up.  Thayer Ohm, DNP, APRN, FNP-BC [facility] and Sports Medicine

## 2021-01-10 ENCOUNTER — Telehealth: Payer: Medicare Other

## 2021-01-10 ENCOUNTER — Ambulatory Visit (INDEPENDENT_AMBULATORY_CARE_PROVIDER_SITE_OTHER): Payer: Medicare Other | Admitting: Medical-Surgical

## 2021-01-10 VITALS — Ht 67.0 in | Wt 170.0 lb

## 2021-01-10 DIAGNOSIS — Z0000 Encounter for general adult medical examination without abnormal findings: Secondary | ICD-10-CM

## 2021-01-10 NOTE — Progress Notes (Signed)
MEDICARE ANNUAL WELLNESS VISIT  01/10/2021  Telephone Visit Disclaimer This Medicare AWV was conducted by telephone due to national recommendations for restrictions regarding the COVID-19 Pandemic (e.g. social distancing).  I verified, using two identifiers, that I am speaking with Louis Bennett or their authorized healthcare agent. I discussed the limitations, risks, security, and privacy concerns of performing an evaluation and management service by telephone and the potential availability of an in-person appointment in the future. The patient expressed understanding and agreed to proceed.  Location of Patient: Home Location of Provider (nurse):  Provider home  Subjective:    Louis Bennett is a 55 y.o. male patient of Christen Butter, NP who had a Medicare Annual Wellness Visit today via telephone. Louis Bennett is Legally disabled and lives with an adult companion. he has 5 children. he reports that he is socially active and does interact with friends/family regularly. he is moderately physically active and enjoys working out.  Patient Care Team: Christen Butter, NP as PCP - General (Nurse Practitioner)  Advanced Directives 01/10/2021  Does Patient Have a Medical Advance Directive? No  Would patient like information on creating a medical advance directive? No - Patient declined    Hospital Utilization Over the Past 12 Months: # of hospitalizations or ER visits: 1 # of surgeries: 0  Review of Systems    Patient reports that his overall health is unchanged compared to last year.  History obtained from chart review and the patient  Patient Reported Readings (BP, Pulse, CBG, Weight, etc) Weight: 170 lbs Height 26f7in  Pain Assessment Pain : No/denies pain     Current Medications & Allergies (verified) Allergies as of 01/10/2021   No Known Allergies      Medication List        Accurate as of January 10, 2021  9:35 AM. If you have any questions, ask your nurse or  doctor.          abacavir-dolutegravir-lamiVUDine 600-50-300 MG tablet Commonly known as: TRIUMEQ Take 1 tablet by mouth every morning.   benzonatate 100 MG capsule Commonly known as: TESSALON Take by mouth 4 (four) times daily as needed.   VITAMIN C PO Take by mouth daily.   VITAMIN D3 PO Take by mouth daily.   ZINC PO Take by mouth daily.        History (reviewed): Past Medical History:  Diagnosis Date   Arthritis    GERD (gastroesophageal reflux disease)    HIV positive (HCC)    History reviewed. No pertinent surgical history. Family History  Problem Relation Age of Onset   Hypertension Mother    Diabetes Mother    Arthritis Mother    Heart failure Brother    Hypertension Brother    Stroke Brother    Social History   Socioeconomic History   Marital status: Significant Other    Spouse name: Asencion Partridge   Number of children: 5   Years of education: 12   Highest education level: 12th grade  Occupational History   Occupation: Legally Disabled  Tobacco Use   Smoking status: Former    Types: Cigarettes    Quit date: 03/23/2020    Years since quitting: 0.8   Smokeless tobacco: Never  Vaping Use   Vaping Use: Some days   Substances: Nicotine  Substance and Sexual Activity   Alcohol use: Not Currently    Comment: Rarely   Drug use: Never   Sexual activity: Yes    Partners: Female    Birth  control/protection: Condom, None  Other Topics Concern   Not on file  Social History Narrative   Lives with significant other, Misty Stanley. He has five children. He enjoys working out.    Social Determinants of Health   Financial Resource Strain: Low Risk    Difficulty of Paying Living Expenses: Not hard at all  Food Insecurity: No Food Insecurity   Worried About Programme researcher, broadcasting/film/video in the Last Year: Never true   Ran Out of Food in the Last Year: Never true  Transportation Needs: No Transportation Needs   Lack of Transportation (Medical): No   Lack of  Transportation (Non-Medical): No  Physical Activity: Sufficiently Active   Days of Exercise per Week: 3 days   Minutes of Exercise per Session: 60 min  Stress: No Stress Concern Present   Feeling of Stress : Not at all  Social Connections: Moderately Integrated   Frequency of Communication with Friends and Family: More than three times a week   Frequency of Social Gatherings with Friends and Family: Once a week   Attends Religious Services: 1 to 4 times per year   Active Member of [employer] or Organizations: No   Attends Banker Meetings: Never   Marital Status: Living with partner    Activities of Daily Living In your present state of health, do you have any difficulty performing the following activities: 01/10/2021  Hearing? N  Vision? N  Difficulty concentrating or making decisions? N  Walking or climbing stairs? N  Dressing or bathing? N  Doing errands, shopping? N  Preparing Food and eating ? N  Using the Toilet? N  In the past six months, have you accidently leaked urine? N  Do you have problems with loss of bowel control? N  Managing your Medications? N  Managing your Finances? N  Housekeeping or managing your Housekeeping? N  Some recent data might be hidden    Patient Education/ Literacy How often do you need to have someone help you when you read instructions, pamphlets, or other written materials from your doctor or pharmacy?: 1 - Never What is the last grade level you completed in school?: 12th grade  Exercise Current Exercise Habits: Home exercise routine, Type of exercise: strength training/weights, Time (Minutes): 60, Frequency (Times/Week): 3, Weekly Exercise (Minutes/Week): 180, Intensity: Moderate, Exercise limited by: None identified  Diet Patient reports consuming 3 meals a day and 2-3 snack(s) a day Patient reports that his primary diet is: Regular Patient reports that she does have regular access to food.   Depression Screen PHQ 2/9 Scores  01/10/2021 09/28/2019  PHQ - 2 Score 0 0  PHQ- 9 Score - 0     Fall Risk Fall Risk  01/10/2021 09/28/2019  Falls in the past year? 0 0  Number falls in past yr: 0 -  Injury with Fall? 0 -  Risk for fall due to : No Fall Risks -  Follow up Falls evaluation completed Falls evaluation completed     Objective:  Louis Bennett seemed alert and oriented and he participated appropriately during our telephone visit.  Blood Pressure Weight BMI  BP Readings from Last 3 Encounters:  01/11/20 132/82  09/28/19 120/85  02/07/18 (!) 143/89   Wt Readings from Last 3 Encounters:  01/10/21 170 lb (77.1 kg)  01/11/20 182 lb 9.6 oz (82.8 kg)  09/28/19 176 lb 11.2 oz (80.2 kg)   BMI Readings from Last 1 Encounters:  01/10/21 26.63 kg/m    *Unable to obtain  current vital signs, weight, and BMI due to telephone visit type  Hearing/Vision  Louis Bennett did not seem to have difficulty with hearing/understanding during the telephone conversation Reports that he has not had a formal eye exam by an eye care professional within the past year Reports that he has not had a formal hearing evaluation within the past year *Unable to fully assess hearing and vision during telephone visit type  Cognitive Function: 6CIT Screen 01/10/2021  What Year? 0 points  What month? 0 points  What time? 0 points  Count back from 20 0 points  Months in reverse 0 points  Repeat phrase 0 points  Total Score 0   (Normal:0-7, Significant for Dysfunction: >8)  Normal Cognitive Function Screening: Yes   Immunization & Health Maintenance Record Immunization History  Administered Date(s) Administered   Hep A / Hep B 04/15/2012, 08/12/2012, 11/25/2012   Influenza Split 04/11/2013, 03/14/2018, 01/17/2019, 01/11/2020   Influenza,inj,Quad PF,6+ Mos 01/11/2020   Influenza-Unspecified 04/11/2013, 03/14/2018, 01/17/2019   PPD Test 07/13/2011, 08/12/2012   Pneumococcal Conjugate-13 07/13/2011   Pneumococcal  Polysaccharide-23 04/15/2012, 03/14/2018   Tdap 04/29/2011    Health Maintenance  Topic Date Due   COVID-19 Vaccine (1) 01/26/2021 (Originally 06/28/1966)   Zoster Vaccines- Shingrix (1 of 2) 04/12/2021 (Originally 12/29/1982)   INFLUENZA VACCINE  06/27/2021 (Originally 10/28/2020)   COLONOSCOPY (Pts 45-40yrs Insurance coverage will need to be confirmed)  01/10/2022 (Originally 12/28/2008)   TETANUS/TDAP  04/28/2021   Hepatitis C Screening  Completed   HIV Screening  Completed   HPV VACCINES  Aged Out       Assessment  This is a routine wellness examination for [employer]  Health Maintenance: Due or Overdue There are no preventive care reminders to display for this patient.   Louis Bennett does not need a referral for [employer] Assistance: Care Management:   no Social Work:    no Prescription Assistance:  no Nutrition/Diabetes Education:  no   Plan:  Personalized Goals  Goals Addressed               This Visit's Progress     Patient Stated (pt-stated)        01/10/2021 AWV Goal: Improved Nutrition/Diet  Patient will verbalize understanding that diet plays an important role in overall health and that a poor diet is a risk factor for many chronic medical conditions.  Over the next year, patient will improve self management of their diet by incorporating more water. Patient will utilize available community resources to help with food acquisition if needed (ex: food pantries, [address], etc) Patient will work with nutrition specialist if a referral was made        Personalized Health Maintenance & Screening Recommendations  Influenza vaccine Colorectal cancer screening Shingles vaccine  Lung Cancer Screening Recommended: no (Low Dose CT Chest recommended if Age 63-80 years, 30 pack-year currently smoking OR have quit w/in past 15 years) Hepatitis C Screening recommended: no HIV Screening recommended: no  Advanced Directives: Written information was not  prepared per patient's request.  Referrals & Orders No orders of the defined types were placed in this encounter.   Follow-up Plan Follow-up with Christen Butter, NP as planned Schedule your shingles vaccine at the pharmacy.  Flu shot can be done at your in-office visit or at the pharmacy.  Please let us know if you change your mind about the colonoscopy or cologuard.  Medicare wellness visit in one year.  AVS printed and mailed to the patient.  I have personally reviewed and noted the following in the patient's chart:   Medical and social history Use of alcohol, tobacco or illicit drugs  Current medications and supplements Functional ability and status Nutritional status Physical activity Advanced directives List of other physicians Hospitalizations, surgeries, and ER visits in previous 12 months Vitals Screenings to include cognitive, depression, and falls Referrals and appointments  In addition, I have reviewed and discussed with Louis Bennett certain preventive protocols, quality metrics, and best practice recommendations. A written personalized care plan for preventive services as well as general preventive health recommendations is available and can be mailed to the patient at his request.      Modesto Charon, RN  01/10/2021

## 2021-01-10 NOTE — Patient Instructions (Addendum)
MEDICARE ANNUAL WELLNESS VISIT Health Maintenance Summary and Written Plan of Care  [name] ,  Thank you for allowing me to perform your Medicare Annual Wellness Visit and for your ongoing commitment to your health.   Health Maintenance & Immunization History Health Maintenance  Topic Date Due  . COVID-19 Vaccine (1) 01/26/2021 (Originally 06/28/1966)  . Zoster Vaccines- Shingrix (1 of 2) 04/12/2021 (Originally 12/29/1982)  . INFLUENZA VACCINE  06/27/2021 (Originally 10/28/2020)  . COLONOSCOPY (Pts 45-89yrs Insurance coverage will need to be confirmed)  01/10/2022 (Originally 12/28/2008)  . TETANUS/TDAP  04/28/2021  . Hepatitis C Screening  Completed  . HIV Screening  Completed  . HPV VACCINES  Aged Out   Immunization History  Administered Date(s) Administered  . Hep A / Hep B 04/15/2012, 08/12/2012, 11/25/2012  . Influenza Split 04/11/2013, 03/14/2018, 01/17/2019, 01/11/2020  . Influenza,inj,Quad PF,6+ Mos 01/11/2020  . Influenza-Unspecified 04/11/2013, 03/14/2018, 01/17/2019  . PPD Test 07/13/2011, 08/12/2012  . Pneumococcal Conjugate-13 07/13/2011  . Pneumococcal Polysaccharide-23 04/15/2012, 03/14/2018  . Tdap 04/29/2011    These are the patient goals that we discussed:  Goals Addressed              This Visit's Progress   .  Patient Stated (pt-stated)        01/10/2021 AWV Goal: Improved Nutrition/Diet  Patient will verbalize understanding that diet plays an important role in overall health and that a poor diet is a risk factor for many chronic medical conditions.  Over the next year, patient will improve self management of their diet by incorporating more water. Patient will utilize available community resources to help with food acquisition if needed (ex: food pantries, [address], etc) Patient will work with nutrition specialist if a referral was made         This is a list of Health Maintenance Items that are overdue or due now: Influenza  vaccine Colorectal cancer screening Shingles vaccine  Orders/Referrals Placed Today: No orders of the defined types were placed in this encounter.  (Contact our referral department at [phone] if you have not spoken with someone about your referral appointment within the next 5 days)    Follow-up Plan Follow-up with Christen Butter, NP as planned Schedule your shingles vaccine at the pharmacy.  Flu shot can be done at your in-office visit or at the pharmacy.  Please let us know if you change your mind about the colonoscopy or cologuard.  Medicare wellness visit in one year.  AVS printed and mailed to the patient.      Colonoscopy, Adult A colonoscopy is a procedure to look at the entire large intestine. This procedure is done using a long, thin, flexible tube that has a camera on the end. You may have a colonoscopy: As a part of normal colorectal screening. If you have certain symptoms, such as: A low number of red blood cells in your blood (anemia). Diarrhea that does not go away. Pain in your abdomen. Blood in your stool. A colonoscopy can help screen for and diagnose medical problems, including: Tumors. Extra tissue that grows where mucus forms (polyps). Inflammation. Areas of bleeding. Tell your health care provider about: Any allergies you have. All medicines you are taking, including vitamins, herbs, eye drops, creams, and over-the-counter medicines. Any problems you or family members have had with anesthetic medicines. Any blood disorders you have. Any surgeries you have had. Any medical conditions you have. Any problems you have had with having bowel movements. Whether you are pregnant or  may be pregnant. What are the risks? Generally, this is a safe procedure. However, problems may occur, including: Bleeding. Damage to your intestine. Allergic reactions to medicines given during the procedure. Infection. This is rare. What happens before the  procedure? Eating and drinking restrictions Follow instructions from your health care provider about eating or drinking restrictions, which may include: A few days before the procedure: Follow a low-fiber diet. Avoid nuts, seeds, dried fruit, raw fruits, and vegetables. 1-3 days before the procedure: Eat only gelatin dessert or ice pops. Drink only clear liquids, such as water, clear juice, clear broth or bouillon, black coffee or tea, or clear soft drinks or sports drinks. Avoid liquids that contain red or purple dye. The day of the procedure: Do not eat solid foods. You may continue to drink clear liquids until up to 2 hours before the procedure. Do not eat or drink anything starting 2 hours before the procedure, or within the time period that your health care provider recommends. Bowel prep If you were prescribed a bowel prep to take by mouth (orally) to clean out your colon: Take it as told by your health care provider. Starting the day before your procedure, you will need to drink a large amount of liquid medicine. The liquid will cause you to have many bowel movements of loose stool until your stool becomes almost clear or light green. If your skin or the opening between the buttocks (anus) gets irritated from diarrhea, you may relieve the irritation using: Wipes with medicine in them, such as adult wet wipes with aloe and vitamin E. A product to soothe skin, such as petroleum jelly. If you vomit while drinking the bowel prep: Take a break for up to 60 minutes. Begin the bowel prep again. Call your health care provider if you keep vomiting or you cannot take the bowel prep without vomiting. To clean out your colon, you may also be given: Laxative medicines. These help you have a bowel movement. Instructions for enema use. An enema is liquid medicine injected into your rectum. Medicines Ask your health care provider about: Changing or stopping your regular medicines or supplements.  This is especially important if you are taking iron supplements, diabetes medicines, or blood thinners. Taking medicines such as aspirin and ibuprofen. These medicines can thin your blood. Do not take these medicines unless your health care provider tells you to take them. Taking over-the-counter medicines, vitamins, herbs, and supplements. General instructions Ask your health care provider what steps will be taken to help prevent infection. These may include washing skin with a germ-killing soap. Plan to have someone take you home from the hospital or clinic. What happens during the procedure?  An IV will be inserted into one of your veins. You may be given one or more of the following: A medicine to help you relax (sedative). A medicine to numb the area (local anesthetic). A medicine to make you fall asleep (general anesthetic). This is rarely needed. You will lie on your side with your knees bent. The tube will: Have oil or gel put on it (be lubricated). Be inserted into your anus. Be gently eased through all parts of your large intestine. Air will be sent into your colon to keep it open. This may cause some pressure or cramping. Images will be taken with the camera and will appear on a screen. A small tissue sample may be removed to be looked at under a microscope (biopsy). The tissue may be sent to a  lab for testing if any signs of problems are found. If small polyps are found, they may be removed and checked for cancer cells. When the procedure is finished, the tube will be removed. The procedure may vary among health care providers and hospitals. What happens after the procedure? Your blood pressure, heart rate, breathing rate, and blood oxygen level will be monitored until you leave the hospital or clinic. You may have a small amount of blood in your stool. You may pass gas and have mild cramping or bloating in your abdomen. This is caused by the air that was used to open your  colon during the exam. Do not drive for 24 hours after the procedure. It is up to you to get the results of your procedure. Ask your health care provider, or the department that is doing the procedure, when your results will be ready. Summary A colonoscopy is a procedure to look at the entire large intestine. Follow instructions from your health care provider about eating and drinking before the procedure. If you were prescribed an oral bowel prep to clean out your colon, take it as told by your health care provider. During the colonoscopy, a flexible tube with a camera on its end is inserted into the anus and then passed into the other parts of the large intestine. This information is not intended to replace advice given to you by your health care provider. Make sure you discuss any questions you have with your health care provider. Document Revised: 10/07/2018 Document Reviewed: 10/07/2018 Elsevier Patient Education  2022 [employer]  Health Maintenance, Male Adopting a healthy lifestyle and getting preventive care are important in promoting health and wellness. Ask your health care provider about: The right schedule for you to have regular tests and exams. Things you can do on your own to prevent diseases and keep yourself healthy. What should I know about diet, weight, and exercise? Eat a healthy diet  Eat a diet that includes plenty of vegetables, fruits, low-fat dairy products, and lean protein. Do not eat a lot of foods that are high in solid fats, added sugars, or sodium. Maintain a healthy weight Body mass index (BMI) is a measurement that can be used to identify possible weight problems. It estimates body fat based on height and weight. Your health care provider can help determine your BMI and help you achieve or maintain a healthy weight. Get regular exercise Get regular exercise. This is one of the most important things you can do for your health. Most adults should: Exercise  for at least 150 minutes each week. The exercise should increase your heart rate and make you sweat (moderate-intensity exercise). Do strengthening exercises at least twice a week. This is in addition to the moderate-intensity exercise. Spend less time sitting. Even light physical activity can be beneficial. Watch cholesterol and blood lipids Have your blood tested for lipids and cholesterol at 57 years of age, then have this test every 5 years. You may need to have your cholesterol levels checked more often if: Your lipid or cholesterol levels are high. You are older than 57 years of age. You are at high risk for heart disease. What should I know about cancer screening? Many types of cancers can be detected early and may often be prevented. Depending on your health history and family history, you may need to have cancer screening at various ages. This may include screening for: Colorectal cancer. Prostate cancer. Skin cancer. Lung cancer. What should I know  about heart disease, diabetes, and high blood pressure? Blood pressure and heart disease High blood pressure causes heart disease and increases the risk of stroke. This is more likely to develop in people who have high blood pressure readings, are of African descent, or are overweight. Talk with your health care provider about your target blood pressure readings. Have your blood pressure checked: Every 3-5 years if you are 89-16 years of age. Every year if you are 75 years old or older. If you are between the ages of 82 and 70 and are a current or former smoker, ask your health care provider if you should have a one-time screening for abdominal aortic aneurysm (AAA). Diabetes Have regular diabetes screenings. This checks your fasting blood sugar level. Have the screening done: Once every three years after age 77 if you are at a normal weight and have a low risk for diabetes. More often and at a younger age if you are overweight or have  a high risk for diabetes. What should I know about preventing infection? Hepatitis B If you have a higher risk for hepatitis B, you should be screened for this virus. Talk with your health care provider to find out if you are at risk for hepatitis B infection. Hepatitis C Blood testing is recommended for: Everyone born from [birth year] through 1967. Anyone with known risk factors for hepatitis C. Sexually transmitted infections (STIs) You should be screened each year for STIs, including gonorrhea and chlamydia, if: You are sexually active and are younger than 57 years of age. You are older than 57 years of age and your health care provider tells you that you are at risk for this type of infection. Your sexual activity has changed since you were last screened, and you are at increased risk for chlamydia or gonorrhea. Ask your health care provider if you are at risk. Ask your health care provider about whether you are at high risk for HIV. Your health care provider may recommend a prescription medicine to help prevent HIV infection. If you choose to take medicine to prevent HIV, you should first get tested for HIV. You should then be tested every 3 months for as long as you are taking the medicine. Follow these instructions at home: Lifestyle Do not use any products that contain nicotine or tobacco, such as cigarettes, e-cigarettes, and chewing tobacco. If you need help quitting, ask your health care provider. Do not use street drugs. Do not share needles. Ask your health care provider for help if you need support or information about quitting drugs. Alcohol use Do not drink alcohol if your health care provider tells you not to drink. If you drink alcohol: Limit how much you have to 0-2 drinks a day. Be aware of how much alcohol is in your drink. In the [place] one drink equals one 12 oz bottle of beer (355 mL), one 5 oz glass of wine (148 mL), or one 1 oz glass of hard liquor (44 mL). General  instructions Schedule regular health, dental, and eye exams. Stay current with your vaccines. Tell your health care provider if: You often feel depressed. You have ever been abused or do not feel safe at home. Summary Adopting a healthy lifestyle and getting preventive care are important in promoting health and wellness. Follow your health care provider's instructions about healthy diet, exercising, and getting tested or screened for diseases. Follow your health care provider's instructions on monitoring your cholesterol and blood pressure. This information is not intended  to replace advice given to you by your health care provider. Make sure you discuss any questions you have with your health care provider. Document Revised: 05/24/2020 Document Reviewed: 03/09/2018 Elsevier Patient Education  2022 [employer].

## 2021-01-15 ENCOUNTER — Ambulatory Visit (INDEPENDENT_AMBULATORY_CARE_PROVIDER_SITE_OTHER): Payer: Medicare Other | Admitting: Medical-Surgical

## 2021-01-15 ENCOUNTER — Encounter: Payer: Self-pay | Admitting: Medical-Surgical

## 2021-01-15 ENCOUNTER — Other Ambulatory Visit: Payer: Self-pay

## 2021-01-15 VITALS — BP 119/81 | HR 92 | Resp 20 | Ht 67.0 in | Wt 184.0 lb

## 2021-01-15 DIAGNOSIS — Z23 Encounter for immunization: Secondary | ICD-10-CM

## 2021-01-15 DIAGNOSIS — B20 Human immunodeficiency virus [HIV] disease: Secondary | ICD-10-CM | POA: Diagnosis not present

## 2021-01-15 DIAGNOSIS — E785 Hyperlipidemia, unspecified: Secondary | ICD-10-CM

## 2021-01-15 DIAGNOSIS — T148XXA Other injury of unspecified body region, initial encounter: Secondary | ICD-10-CM | POA: Diagnosis not present

## 2021-01-15 DIAGNOSIS — N183 Chronic kidney disease, stage 3 unspecified: Secondary | ICD-10-CM

## 2021-01-15 MED ORDER — CELECOXIB 200 MG PO CAPS: ORAL_CAPSULE | ORAL | 2 refills | Status: DC

## 2021-01-15 NOTE — Progress Notes (Signed)
  HPI with pertinent ROS:   CC: General follow-up  HPI: Pleasant 55 year old male presenting today for general follow-up.  Has been doing very well and working to stay healthy as possible.  He is continuing Triumeq as prescribed once daily.  Has stopped all other supplementations and vitamins.  No unusual symptoms outside of some chest and ornis.  Notes that he does have a achy feeling to the right of his sternum as well as to the right of his thoracic spine.  The areas are not sore to touch but the aching is worse with certain movements, namely bench pressing heavy weights.  He does exercise very regularly and wonders if this may be a musculoskeletal issue.  He has not had any chest tightness, shortness of breath, diaphoresis, palpitations, nausea, vomiting, left arm pain, or activity intolerance.  Has been limiting his bench pressing weight to no more than 180 pounds for the past couple of weeks but has done no other home treatment.  I reviewed the past medical history, family history, social history, surgical history, and allergies today and no changes were needed.  Please see the problem list section below in epic for further details.   Physical exam:   General: Well Developed, well nourished, and in no acute distress.  Neuro: Alert and oriented x3.  HEENT: Normocephalic, atraumatic.  Skin: Warm and dry. Cardiac: Regular rate and rhythm, no murmurs rubs or gallops, no lower extremity edema.  Respiratory: Clear to auscultation bilaterally. Not using accessory muscles, speaking in full sentences.  Impression and Recommendations:    1. Stage 3 chronic kidney disease, unspecified whether stage 3a or 3b CKD (HCC) Checking CBC with differential and CMP today. - CBC with Differential/Platelet - COMPLETE METABOLIC PANEL WITH GFR  2. HIV infection, unspecified symptom status (HCC) Overdue for follow-up with infectious disease and associated labs so we will check HIV RNA and T cells today.   Continue Triumeq. - HIV-1 RNA ultraquant reflex to gentyp+ - T-helper cells (CD4) count  3. Hyperlipidemia, unspecified hyperlipidemia type Checking lipid panel today. - Lipid panel  4. Muscle strain Although not sore to touch, suspect he may have a muscle strain especially with worsening with lifting.  Recommend limiting bench pressing.  Sending in Celebrex 200 mg twice daily to use regularly for the next 2 weeks.  If no improvement or if other signs and symptoms develop, recommend he return for further evaluation.  Patient verbalized understanding and is agreeable to the plan.  Return in about 6 months (around 07/16/2021) for general follow up. ___________________________________________ Thayer Ohm, DNP, APRN, FNP-BC Primary Care and Sports Medicine [facility]

## 2021-01-16 ENCOUNTER — Encounter: Payer: Self-pay | Admitting: Medical-Surgical

## 2021-01-16 ENCOUNTER — Telehealth: Payer: Self-pay

## 2021-01-16 DIAGNOSIS — E785 Hyperlipidemia, unspecified: Secondary | ICD-10-CM

## 2021-01-16 MED ORDER — ATORVASTATIN CALCIUM 20 MG PO TABS
20.0000 mg | ORAL_TABLET | Freq: Every day | ORAL | 3 refills | Status: DC
Start: 2021-01-16 — End: 2021-12-22

## 2021-01-16 NOTE — Telephone Encounter (Signed)
Freddie called and asked if it would be possible to get a letter stating it's necessary for him to have medical appointment driving privileges. He said his driving hearing is Nov 1st and it might help at least let him be able to drive to his appointments. Thanks.

## 2021-01-17 ENCOUNTER — Encounter: Payer: Self-pay | Admitting: Medical-Surgical

## 2021-01-18 LAB — CBC WITH DIFFERENTIAL/PLATELET
Absolute Monocytes: 317 cells/uL (ref 200–950)
Basophils Absolute: 18 cells/uL (ref 0–200)
Basophils Relative: 0.4 %
Eosinophils Absolute: 41 cells/uL (ref 15–500)
Eosinophils Relative: 0.9 %
HCT: 41.7 % (ref 38.5–50.0)
Hemoglobin: 14.1 g/dL (ref 13.2–17.1)
Lymphs Abs: 1164 cells/uL (ref 850–3900)
MCH: 30.6 pg (ref 27.0–33.0)
MCHC: 33.8 g/dL (ref 32.0–36.0)
MCV: 90.5 fL (ref 80.0–100.0)
MPV: 9.9 fL (ref 7.5–12.5)
Monocytes Relative: 6.9 %
Neutro Abs: 3059 cells/uL (ref 1500–7800)
Neutrophils Relative %: 66.5 %
Platelets: 267 10*3/uL (ref 140–400)
RBC: 4.61 10*6/uL (ref 4.20–5.80)
RDW: 12.7 % (ref 11.0–15.0)
Total Lymphocyte: 25.3 %
WBC: 4.6 10*3/uL (ref 3.8–10.8)

## 2021-01-18 LAB — T-HELPER CELLS (CD4) COUNT (NOT AT ARMC)
Absolute CD4: 592 cells/uL (ref 490–1740)
CD4 T Helper %: 45 % (ref 30–61)
Total lymphocyte count: 1316 cells/uL (ref 850–3900)

## 2021-01-18 LAB — COMPLETE METABOLIC PANEL WITH GFR
AG Ratio: 1.5 (calc) (ref 1.0–2.5)
ALT: 26 U/L (ref 9–46)
AST: 18 U/L (ref 10–35)
Albumin: 4.3 g/dL (ref 3.6–5.1)
Alkaline phosphatase (APISO): 63 U/L (ref 35–144)
BUN/Creatinine Ratio: 12 (calc) (ref 6–22)
BUN: 18 mg/dL (ref 7–25)
CO2: 28 mmol/L (ref 20–32)
Calcium: 9.5 mg/dL (ref 8.6–10.3)
Chloride: 106 mmol/L (ref 98–110)
Creat: 1.46 mg/dL — ABNORMAL HIGH (ref 0.70–1.30)
Globulin: 2.8 g/dL (calc) (ref 1.9–3.7)
Glucose, Bld: 96 mg/dL (ref 65–99)
Potassium: 4.5 mmol/L (ref 3.5–5.3)
Sodium: 141 mmol/L (ref 135–146)
Total Bilirubin: 0.3 mg/dL (ref 0.2–1.2)
Total Protein: 7.1 g/dL (ref 6.1–8.1)
eGFR: 56 mL/min/{1.73_m2} — ABNORMAL LOW (ref >=60)

## 2021-01-18 LAB — LIPID PANEL
Cholesterol: 229 mg/dL — ABNORMAL HIGH (ref <200)
HDL: 38 mg/dL — ABNORMAL LOW (ref >=40)
LDL Cholesterol (Calc): 158 mg/dL (calc) — ABNORMAL HIGH
Non-HDL Cholesterol (Calc): 191 mg/dL (calc) — ABNORMAL HIGH (ref <130)
Total CHOL/HDL Ratio: 6 (calc) — ABNORMAL HIGH (ref <5.0)
Triglycerides: 176 mg/dL — ABNORMAL HIGH (ref <150)

## 2021-01-18 LAB — HIV-1 RNA ULTRAQUANT REFLEX TO GENTYP+
HIV 1 RNA Quant: <20 Copies/mL — ABNORMAL HIGH
HIV-1 RNA Quant, Log: <1.3 Log cps/mL — ABNORMAL HIGH

## 2021-01-24 ENCOUNTER — Encounter: Payer: Self-pay | Admitting: Medical-Surgical

## 2021-01-27 ENCOUNTER — Telehealth: Payer: Self-pay | Admitting: General Practice

## 2021-01-27 NOTE — Telephone Encounter (Signed)
Transition Care Management Unsuccessful Follow-up Telephone Call  Date of discharge and from where:  01/24/21 from [facility]  Attempts:  1st Attempt  Reason for unsuccessful TCM follow-up call:  Left voice message

## 2021-01-29 NOTE — Telephone Encounter (Signed)
Transition Care Management Follow-up Telephone Call Date of discharge and from where: 01/24/21 from [facility] How have you been since you were released from the hospital? Doing much better. Any questions or concerns? No  Items Reviewed: Did the pt receive and understand the discharge instructions provided? Yes  Medications obtained and verified? Yes  Other? No  Any new allergies since your discharge? No  Dietary orders reviewed? Yes Do you have support at home? Yes   Home Care and Equipment/Supplies: Were home health services ordered? no  Functional Questionnaire: (I = Independent and D = Dependent) ADLs: I  Bathing/Dressing- I  Meal Prep- I  Eating- I  Maintaining continence- I  Transferring/Ambulation- I  Managing Meds- I  Follow up appointments reviewed:  PCP Hospital f/u appt confirmed? Yes  Scheduled to see Louis Hopes, NP on 01/31/21 @ 1010. Specialist Hospital f/u appt confirmed? No  needs a new referral to a cardiologist.  Are transportation arrangements needed? No  If their condition worsens, is the pt aware to call PCP or go to the Emergency Dept.? Yes Was the patient provided with contact information for the PCP's office or ED? Yes Was to pt encouraged to call back with questions or concerns? Yes

## 2021-01-31 ENCOUNTER — Encounter: Payer: Self-pay | Admitting: Family Medicine

## 2021-01-31 ENCOUNTER — Ambulatory Visit (INDEPENDENT_AMBULATORY_CARE_PROVIDER_SITE_OTHER): Payer: Medicare Other | Admitting: Family Medicine

## 2021-01-31 VITALS — BP 116/81 | HR 87 | Temp 98.2°F | Wt 185.0 lb

## 2021-01-31 DIAGNOSIS — R7989 Other specified abnormal findings of blood chemistry: Secondary | ICD-10-CM | POA: Diagnosis not present

## 2021-01-31 DIAGNOSIS — R0789 Other chest pain: Secondary | ICD-10-CM

## 2021-01-31 DIAGNOSIS — S29011D Strain of muscle and tendon of front wall of thorax, subsequent encounter: Secondary | ICD-10-CM

## 2021-01-31 MED ORDER — PREDNISONE 20 MG PO TABS: 40.0000 mg | ORAL_TABLET | Freq: Every day | ORAL | 0 refills | Status: AC

## 2021-01-31 NOTE — Patient Instructions (Addendum)
Trying to set you up for stress test in [place]. Someone will be calling you to schedule.  For possible musculoskeletal pain - giving short course of prednisone for antiinflammatory effect, can try heating pad, gentle stretching  Rechecking labs - do not take any NSAIDs (Aleve or ibuprofen, etc.) until we see your kidney function  Please go to the Emergency Department for worsening chest pain, chest pressure/tightness (feeling like an elephant sitting on your chest), shortness of breath, pain with breathing, chest pain accompanied by sweating, jaw pain or arm pain, feeling like you are going to pass out, etc.

## 2021-01-31 NOTE — Progress Notes (Signed)
Acute Office Visit  Subjective:    Patient ID: Louis Bennett, male    DOB: 05/03/1965, 55 y.o.   MRN: [id number]  Chief Complaint  Patient presents with   Follow-up    Chest pain    HPI Patient is in today for chest discomfort and ED follow-up.   01/15/21 - saw PCP for routine appointment and mentioned some right chest discomfort that sounded musculoskeletal. Started Celebrex, but then had to cancel d/t renal function 01/24/21 ED for mid chest pain with "lump" sensation radiating to back and dizziness. CXR negative; EKG bradycardia with rightward axis, PVCs and possible LAE; Troponins = 14, 13, 11; Mag 2.0; Cr 1.46  01/31/2021 Follow-up: Today he reports he continues to have some mild chest soreness that is noticeable, but not terrible. States it is located right chest and he can pinpoint the most significant location (right of sternum) and then has surrounding aches. Reports the pain is worse with weight lifting (bench press) and deep palpation. He has not been doing anything for the pain other than trying to avoid heavy lifting. States he did go to the gym a few days ago and when he woke up the next morning, the right side of his chest was more sore. He denies any chest pressure, pain with inspiration, diaphoresis, jaw pain, left arm pain, dizziness/lightheadedness, headaches, fatigue.     Past Medical History:  Diagnosis Date   Acute hypoxemic respiratory failure ([place]) 03/29/2020   Arthritis    CKD (chronic kidney disease) stage 3, GFR 30-59 ml/min (HCC)    GERD (gastroesophageal reflux disease)    HIV positive (HCC)    Pneumonia due to COVID-19 virus 03/27/2020   Right lumbar radiculitis     No past surgical history on file.  Family History  Problem Relation Age of Onset   Hypertension Mother    Diabetes Mother    Arthritis Mother    Heart failure Brother    Hypertension Brother    Stroke Brother     Social History   Socioeconomic History   Marital status:  Significant Other    Spouse name: Melvyn Neth   Number of children: 5   Years of education: 12   Highest education level: 12th grade  Occupational History   Occupation: Legally Disabled  Tobacco Use   Smoking status: Former    Types: Cigarettes    Quit date: 03/23/2020    Years since quitting: 0.8   Smokeless tobacco: Never  Vaping Use   Vaping Use: Some days   Substances: Nicotine  Substance and Sexual Activity   Alcohol use: Not Currently    Comment: Rarely   Drug use: Never   Sexual activity: Yes    Partners: Female    Birth control/protection: Condom, None  Other Topics Concern   Not on file  Social History Narrative   Lives with significant other, Lattie Haw. He has five children. He enjoys working out.    Social Determinants of Health   Financial Resource Strain: Low Risk    Difficulty of Paying Living Expenses: Not hard at all  Food Insecurity: No Food Insecurity   Worried About Charity fundraiser in the Last Year: Never true   [place] in the Last Year: Never true  Transportation Needs: No Transportation Needs   Lack of Transportation (Medical): No   Lack of Transportation (Non-Medical): No  Physical Activity: Sufficiently Active   Days of Exercise per Week: 3 days   Minutes of Exercise  per Session: 60 min  Stress: No Stress Concern Present   Feeling of Stress : Not at all  Social Connections: Moderately Integrated   Frequency of Communication with Friends and Family: More than three times a week   Frequency of Social Gatherings with Friends and Family: Once a week   Attends Religious Services: 1 to 4 times per year   Active Member of [employer] or Organizations: No   Attends Archivist Meetings: Never   Marital Status: Living with partner  Intimate Partner Violence: Not At Risk   Fear of Current or Ex-Partner: No   Emotionally Abused: No   Physically Abused: No   Sexually Abused: No    Outpatient Medications Prior to Visit  Medication Sig  Dispense Refill   abacavir-dolutegravir-lamiVUDine (TRIUMEQ) 600-50-300 MG tablet Take 1 tablet by mouth every morning.     Ascorbic Acid (VITAMIN C PO) Take by mouth daily.     atorvastatin (LIPITOR) 20 MG tablet Take 1 tablet (20 mg total) by mouth daily. 90 tablet 3   No facility-administered medications prior to visit.    Not on File  Review of Systems All review of systems negative except what is listed in the HPI     Objective:    Physical Exam Vitals reviewed.  Constitutional:      Appearance: Normal appearance.  Cardiovascular:     Rate and Rhythm: Normal rate and regular rhythm.     Heart sounds: Normal heart sounds.  Pulmonary:     Effort: Pulmonary effort is normal.     Breath sounds: Normal breath sounds.  Musculoskeletal:        General: Tenderness present. Normal range of motion.     Cervical back: Normal range of motion and neck supple.     Comments: Right chest tenderness to palpation to the right of sternum;   Skin:    General: Skin is warm and dry.     Capillary Refill: Capillary refill takes less than 2 seconds.     Findings: No bruising or rash.  Neurological:     General: No focal deficit present.     Mental Status: He is alert and oriented to person, place, and time. Mental status is at baseline.  Psychiatric:        Mood and Affect: Mood normal.        Behavior: Behavior normal.        Thought Content: Thought content normal.        Judgment: Judgment normal.    BP 116/81 (BP Location: Left Arm, Patient Position: Sitting, Cuff Size: Large)   Pulse 87   Temp 98.2 F (36.8 C) (Oral)   Wt 185 lb 0.6 oz (83.9 kg)   SpO2 98%   BMI 28.98 kg/m  Wt Readings from Last 3 Encounters:  01/31/21 185 lb 0.6 oz (83.9 kg)  01/15/21 184 lb (83.5 kg)  01/10/21 170 lb (77.1 kg)    There are no preventive care reminders to display for this patient.   There are no preventive care reminders to display for this patient.   Lab Results  Component Value  Date   TSH 0.96 01/11/2020   Lab Results  Component Value Date   WBC 4.6 01/15/2021   HGB 14.1 01/15/2021   HCT 41.7 01/15/2021   MCV 90.5 01/15/2021   PLT 267 01/15/2021   Lab Results  Component Value Date   NA 141 01/15/2021   K 4.5 01/15/2021   CO2 28 01/15/2021  GLUCOSE 96 01/15/2021   BUN 18 01/15/2021   CREATININE 1.46 (H) 01/15/2021   BILITOT 0.3 01/15/2021   AST 18 01/15/2021   ALT 26 01/15/2021   PROT 7.1 01/15/2021   CALCIUM 9.5 01/15/2021   EGFR 56 (L) 01/15/2021   Lab Results  Component Value Date   CHOL 229 (H) 01/15/2021   Lab Results  Component Value Date   HDL 38 (L) 01/15/2021   Lab Results  Component Value Date   LDLCALC 158 (H) 01/15/2021   Lab Results  Component Value Date   TRIG 176 (H) 01/15/2021   Lab Results  Component Value Date   CHOLHDL 6.0 (H) 01/15/2021   No results found for: HGBA1C     Assessment & Plan:   1. Muscle strain of chest wall, subsequent encounter Presentation suggests musculoskeletal component. Will try short burst of prednisone, heating pad, gentle stretching and massage. Patient aware of signs/symptoms requiring further/urgent evaluation. - predniSONE (DELTASONE) 20 MG tablet; Take 2 tablets (40 mg total) by mouth daily with breakfast for 5 days.  Dispense: 10 tablet; Refill: 0  2. Hypermagnesemia - Magnesium  3. Elevated serum creatinine - Basic metabolic panel  4. Other chest pain ED cardiac workup unremarkable. Patient would like to proceed with stress test to ensure normal. Prefers Jule Ser if possible. Strict ED precautions given. - EXERCISE TOLERANCE TEST (ETT); Future D   Follow-up pending results or as needed. Please contact office for sooner follow-up if symptoms do not improve or worsen. Seek emergency care if symptoms become severe.   Purcell Nails Olevia Bowens, DNP, FNP-C

## 2021-02-01 LAB — BASIC METABOLIC PANEL
BUN/Creatinine Ratio: 11 (calc) (ref 6–22)
BUN: 15 mg/dL (ref 7–25)
CO2: 28 mmol/L (ref 20–32)
Calcium: 9.2 mg/dL (ref 8.6–10.3)
Chloride: 104 mmol/L (ref 98–110)
Creat: 1.4 mg/dL — ABNORMAL HIGH (ref 0.70–1.30)
Glucose, Bld: 102 mg/dL — ABNORMAL HIGH (ref 65–99)
Potassium: 4.3 mmol/L (ref 3.5–5.3)
Sodium: 140 mmol/L (ref 135–146)

## 2021-02-01 LAB — MAGNESIUM: Magnesium: 2 mg/dL (ref 1.5–2.5)

## 2021-07-16 ENCOUNTER — Ambulatory Visit (INDEPENDENT_AMBULATORY_CARE_PROVIDER_SITE_OTHER): Payer: Self-pay | Admitting: Medical-Surgical

## 2021-07-16 DIAGNOSIS — R7303 Prediabetes: Secondary | ICD-10-CM | POA: Insufficient documentation

## 2021-07-16 DIAGNOSIS — Z91199 Patient's noncompliance with other medical treatment and regimen due to unspecified reason: Secondary | ICD-10-CM

## 2021-07-16 NOTE — Progress Notes (Signed)
   Complete physical exam  Patient: Louis Bennett   DOB: 01/17/2001   56 y.o. Male  MRN: [id number]  Subjective:    No chief complaint on file.   Louis Bennett is a 56 y.o. male who presents today for a complete physical exam. She reports consuming a {diet types:17450} diet. {types:19826} She generally feels {DESC; WELL/FAIRLY WELL/POORLY:18703}. She reports sleeping {DESC; WELL/FAIRLY WELL/POORLY:18703}. She {does/does not:200015} have additional problems to discuss today.    Most recent fall risk assessment:    09/24/2021   10:42 AM  Fall Risk   Falls in the past year? 0  Number falls in past yr: 0  Injury with Fall? 0  Risk for fall due to : No Fall Risks  Follow up Falls evaluation completed     Most recent depression screenings:    09/24/2021   10:42 AM 08/15/2020   10:46 AM  PHQ 2/9 Scores  PHQ - 2 Score 0 0  PHQ- 9 Score 5     {VISON DENTAL STD PSA (Optional):27386}  {History (Optional):23778}  Patient Care Team: Johnnette Laux, NP as PCP - General (Nurse Practitioner)   Outpatient Medications Prior to Visit  Medication Sig   fluticasone (FLONASE) 50 MCG/ACT nasal spray Place 2 sprays into both nostrils in the morning and at bedtime. After 7 days, reduce to once daily.   norgestimate-ethinyl estradiol (SPRINTEC 28) 0.25-35 MG-MCG tablet Take 1 tablet by mouth daily.   Nystatin POWD Apply liberally to affected area 2 times per day   spironolactone (ALDACTONE) 100 MG tablet Take 1 tablet (100 mg total) by mouth daily.   No facility-administered medications prior to visit.    ROS        Objective:     There were no vitals taken for this visit. {Vitals History (Optional):23777}  Physical Exam   No results found for any visits on 10/30/21. {Show previous labs (optional):23779}    Assessment & Plan:    Routine Health Maintenance and Physical Exam  Immunization History  Administered Date(s) Administered   DTaP 04/02/1999, 05/29/1999,  08/07/1999, 04/22/2000, 11/06/2003   Hepatitis A 09/02/2007, 09/07/2008   Hepatitis B 01/18/1999, 02/25/1999, 08/07/1999   HiB (PRP-OMP) 04/02/1999, 05/29/1999, 08/07/1999, 04/22/2000   IPV 04/02/1999, 05/29/1999, 01/26/2000, 11/06/2003   Influenza,inj,Quad PF,6+ Mos 12/08/2013   Influenza-Unspecified 03/09/2012   MMR 01/25/2001, 11/06/2003   Meningococcal Polysaccharide 09/07/2011   Pneumococcal Conjugate-13 04/22/2000   Pneumococcal-Unspecified 08/07/1999, 10/21/1999   Tdap 09/07/2011   Varicella 01/26/2000, 09/02/2007    Health Maintenance  Topic Date Due   HIV Screening  Never done   Hepatitis C Screening  Never done   INFLUENZA VACCINE  10/28/2021   PAP-Cervical Cytology Screening  10/30/2021 (Originally 01/17/2020)   PAP SMEAR-Modifier  10/30/2021 (Originally 01/17/2020)   TETANUS/TDAP  10/30/2021 (Originally 09/06/2021)   HPV VACCINES  Discontinued   COVID-19 Vaccine  Discontinued    Discussed health benefits of physical activity, and encouraged her to engage in regular exercise appropriate for her age and condition.  Problem List Items Addressed This Visit   None Visit Diagnoses     Annual physical exam    -  Primary   Cervical cancer screening       Need for Tdap vaccination          No follow-ups on file.     Cory Kitt, NP   

## 2021-11-27 IMAGING — DX DG LUMBAR SPINE COMPLETE 4+V
5 series · 5 of 5 positions shown · non-contrast
Comparison: None.

CLINICAL DATA: Low back pain with right-sided radicular symptoms

EXAM:
LUMBAR SPINE - COMPLETE 4+ VIEW

[l-spine ap]
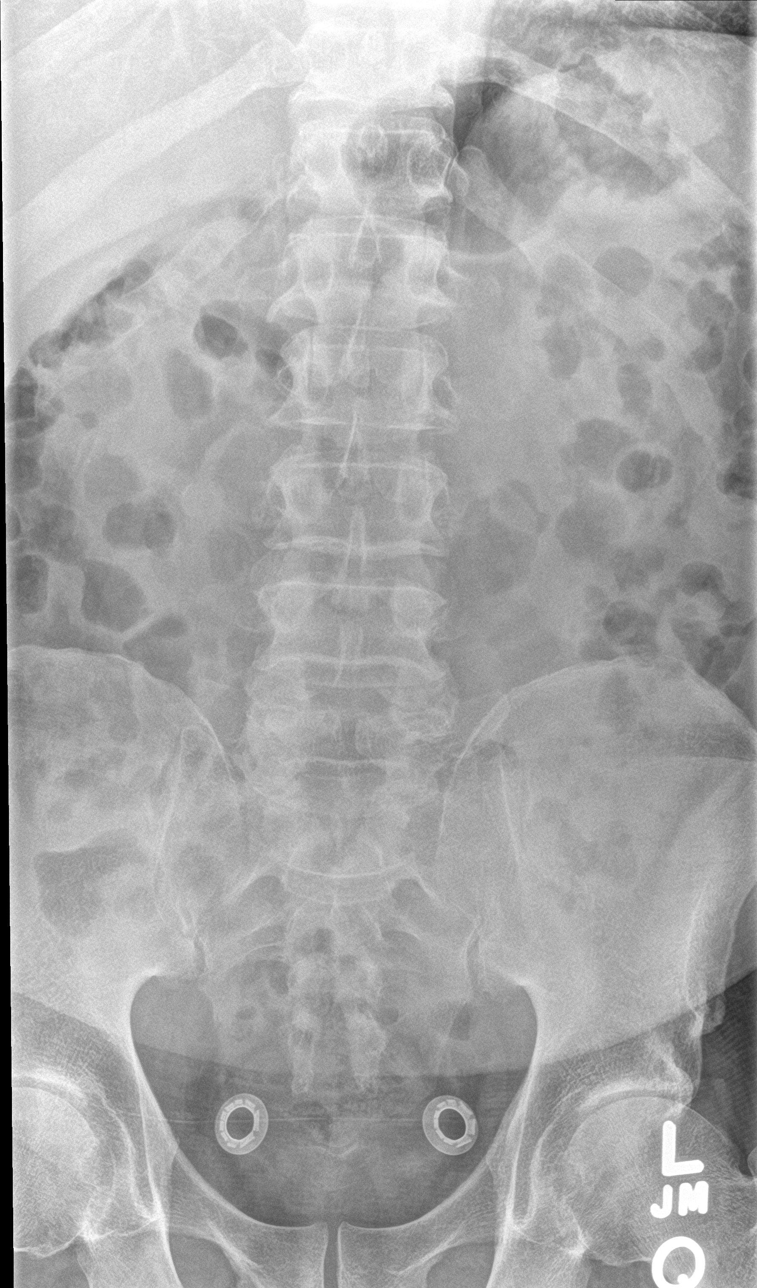

[l-spine obl (1 of 2)]
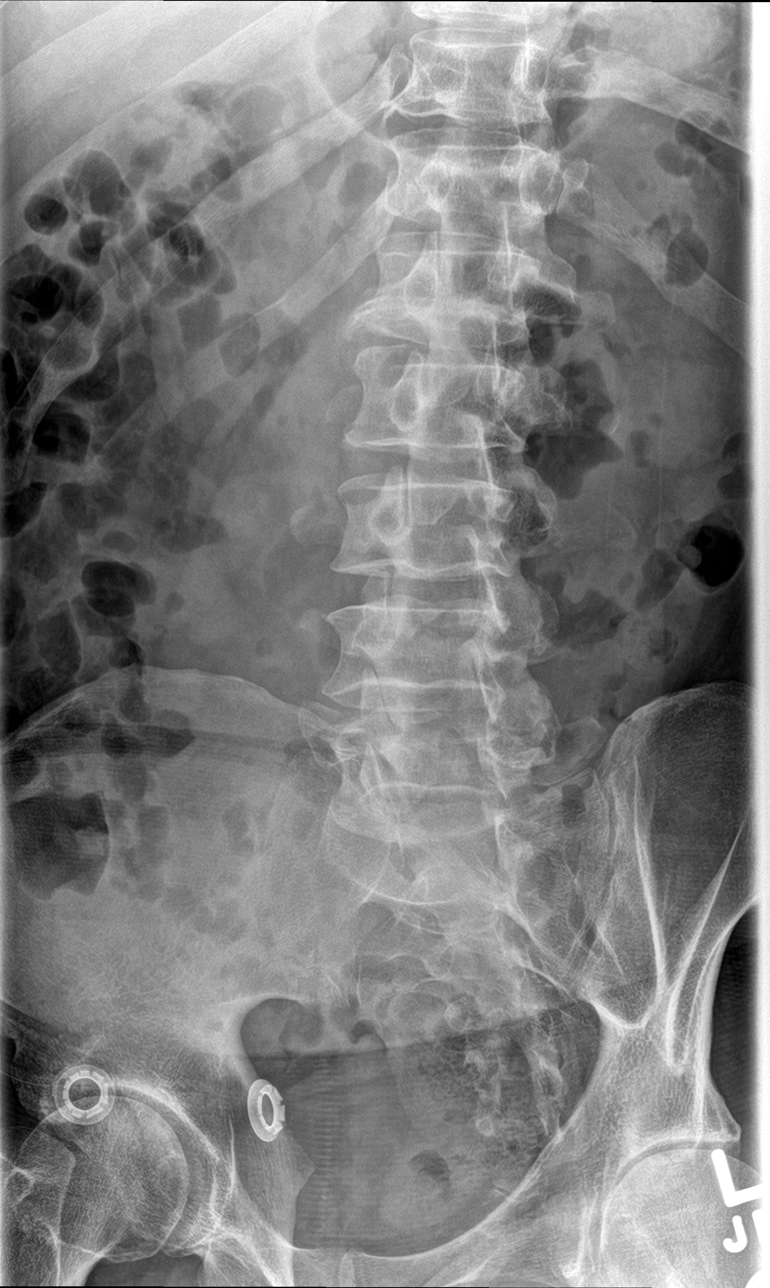

[l-spine obl (2 of 2)]
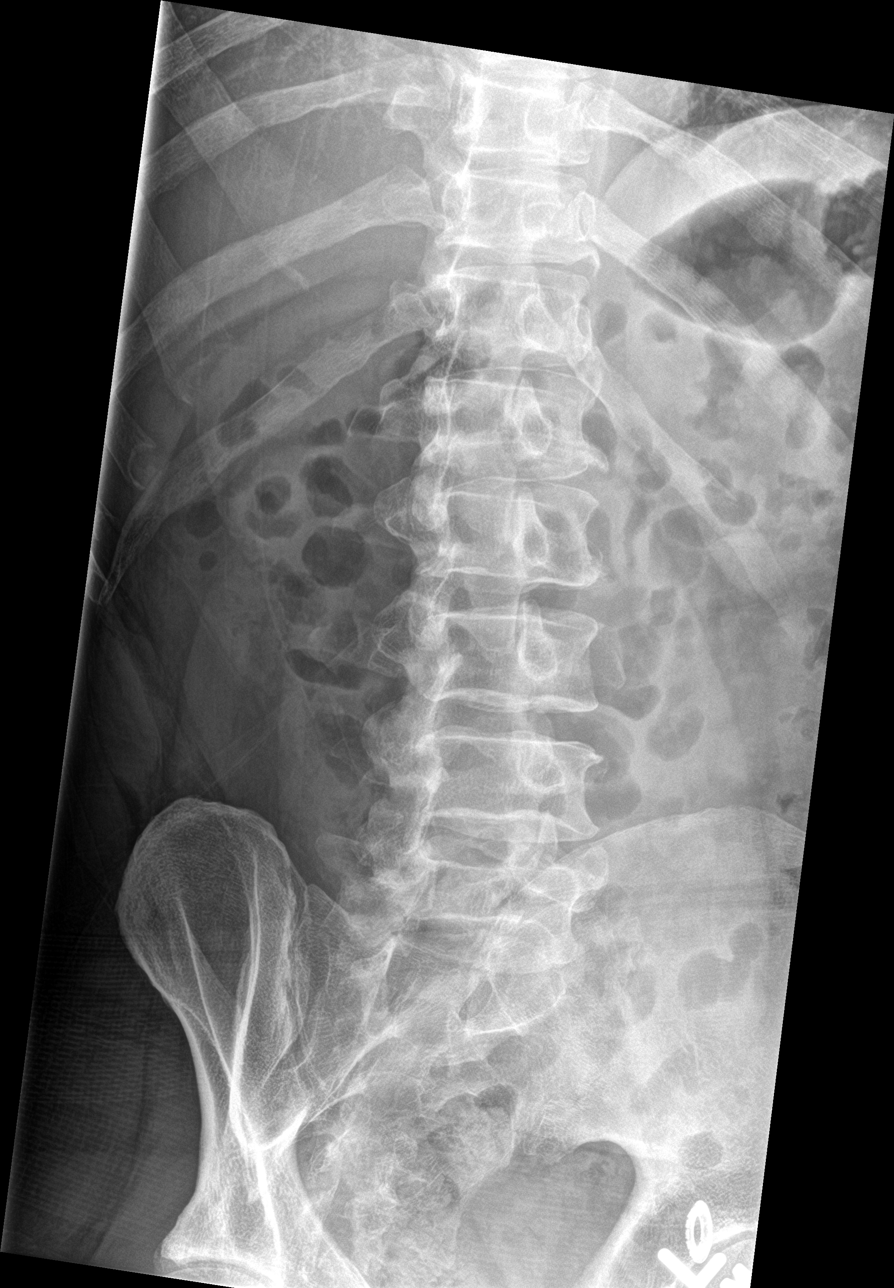

[l-spine lat]
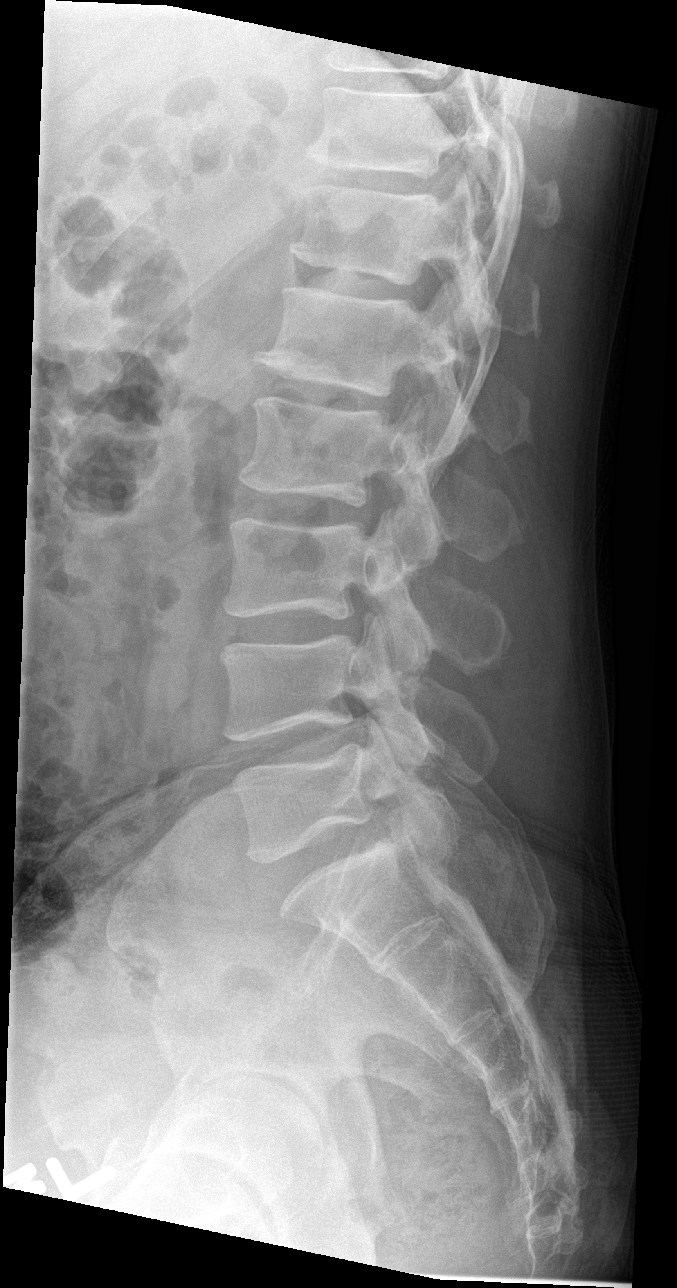

[l-spine spot]
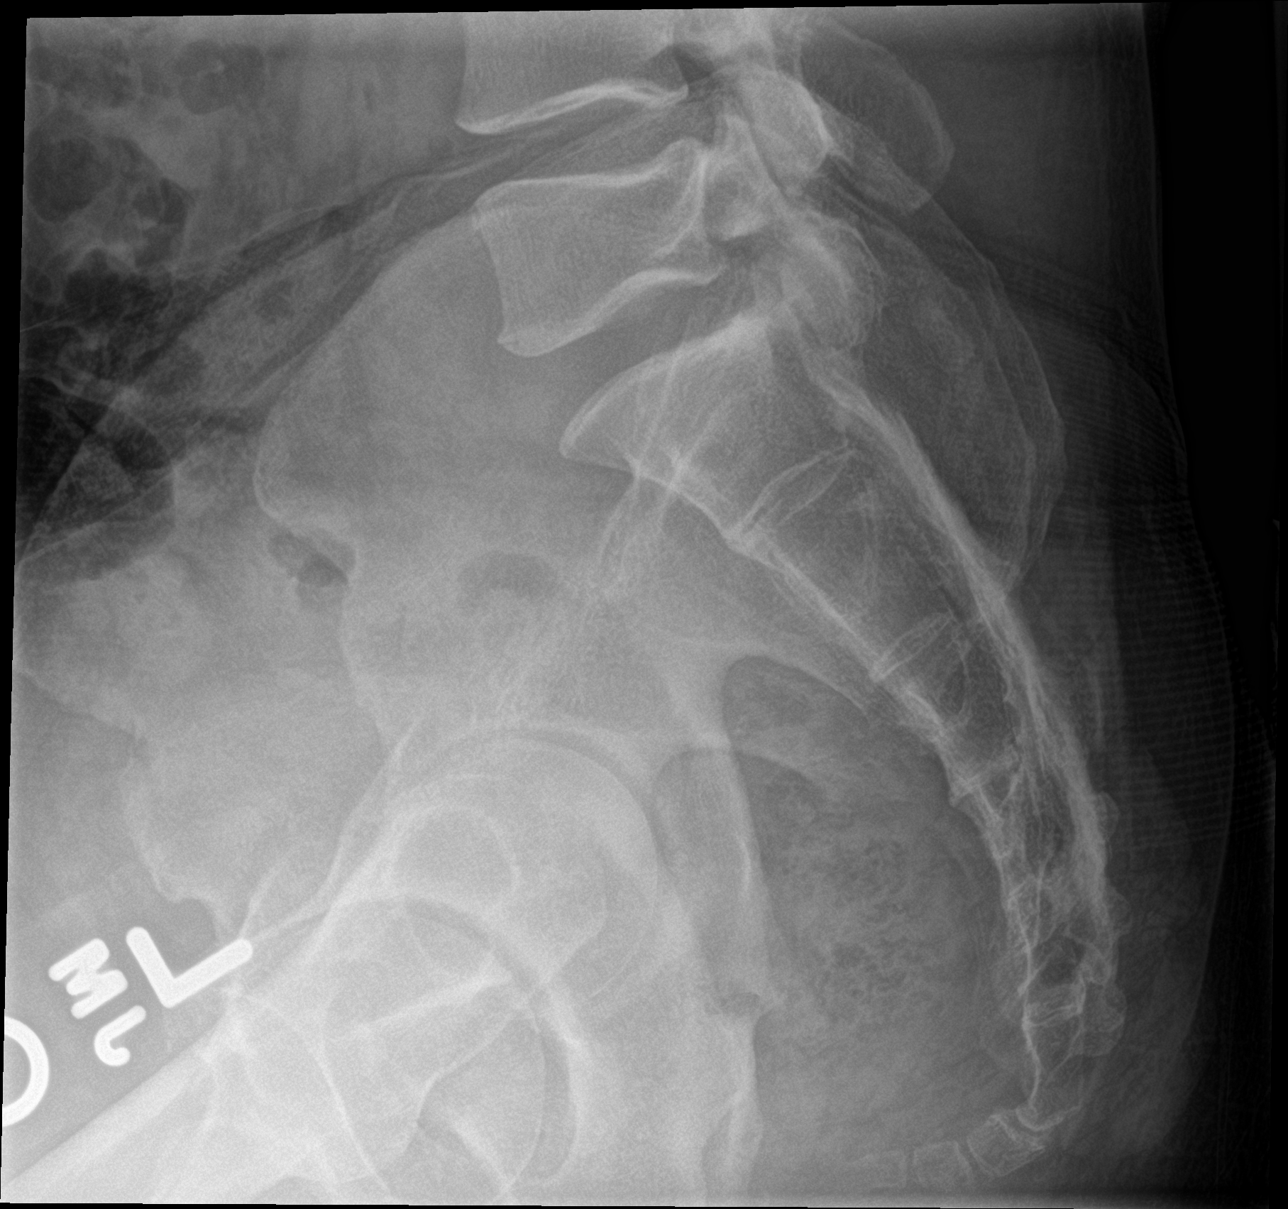

[5 of 5 positions shown; findings below may reference images not displayed]

FINDINGS: Frontal, lateral, spot lumbosacral lateral, and bilateral oblique
views were obtained. There are 5 non-rib-bearing lumbar type
vertebral bodies. There is no fracture or spondylolisthesis. The
disc spaces appear normal. There is mild facet osteoarthritic change
at L4-5 bilaterally. Facets at other levels appear unremarkable.
There is spina bifida occulta at L5.
IMPRESSION: Mild facet osteoarthritic change at L4-5 bilaterally. Facets at
other levels appear unremarkable. No appreciable disc space
narrowing. No fracture or spondylolisthesis.

## 2021-12-21 NOTE — Progress Notes (Signed)
Established Patient Office Visit  Subjective   Patient ID: Louis Bennett, male   DOB: 06-24-1965 Age: 56 y.o. MRN: [id number]   Chief Complaint  Patient presents with   testosterone   Follow-up    HPI Pleasant 56 year old male presenting today for the following:  Hospital discharge follow up: was seen at [facility] on 9/24 with complaints of chest pain after waking up lightheaded. He also noted shortness of breath developed just after he woke up. He had a full evaluation at the ED with no acute etiology determined. The ED provider has placed a referral to connect him with Cardiology.   Has questions about his testosterone levels.  Notes that he has been having issues with erectile dysfunction and wonders if it is related to his testosterone level.  Reports that even in times of intimacy, he has difficulty achieving and maintaining an erection.  He is not waking with an erection in the morning.  Has never tried medications for this but is interested in options.   Objective:    Vitals:   12/22/21 1042  BP: 124/67  Pulse: 70  Resp: 20  Height: 5\' 7"  (1.702 m)  Weight: 175 lb 12.8 oz (79.7 kg)  SpO2: 99%  BMI (Calculated): 27.53    Physical Exam Vitals reviewed.  Constitutional:      General: He is not in acute distress.    Appearance: Normal appearance. He is not ill-appearing.  HENT:     Head: Normocephalic and atraumatic.  Cardiovascular:     Rate and Rhythm: Normal rate and regular rhythm.     Pulses: Normal pulses.     Heart sounds: Normal heart sounds. No murmur heard.    No friction rub. No gallop.  Pulmonary:     Effort: Pulmonary effort is normal. No respiratory distress.     Breath sounds: Normal breath sounds.  Skin:    General: Skin is warm and dry.  Neurological:     Mental Status: He is alert and oriented to person, place, and time.  Psychiatric:        Mood and Affect: Mood normal.        Behavior: Behavior normal.        Thought Content: Thought  content normal.        Judgment: Judgment normal.   No results found for this or any previous visit (from the past 24 hour(s)).     The 10-year ASCVD risk score (Arnett DK, et al., 2019) is: 7.8%   Values used to calculate the score:     Age: 34 years     Sex: Male     Is Non-Hispanic African American: Yes     Diabetic: No     Tobacco smoker: No     Systolic Blood Pressure: 124 mmHg     Is BP treated: No     HDL Cholesterol: 38 mg/dL     Total Cholesterol: 229 mg/dL   Assessment & Plan:   1. Hospital discharge follow-up Reviewed hospital records and discussed the issue with him.  He does feel that his alcohol intake was the cause for his symptoms but he plans to follow through with his appointment on Friday with cardiology.  No further chest pain since his ED visit.  2. Erectile dysfunction, unspecified erectile dysfunction type Discussed the relationship between testosterone and erectile dysfunction with him.  We will go ahead and check his testosterone levels.  Discussed medications that are used for erectile dysfunction.  I would  like for him to get cleared by cardiology prior to starting 1 of these medications.  Advised him to let me know what cardiology says once he has completed that appointment on Friday.  If he is cleared, we will start with sildenafil 20-100 mg daily as needed.  Advised to start slow with a low-dose and increase as tolerated. - Testosterone Total,Free,Bio, Males  3. Hyperlipidemia, unspecified hyperlipidemia type Checking lipid panel today. - Lipid panel  4. Need for Tdap vaccination Unable to administer in our office today due to Medicare coverage.  Advised patient to go to his local pharmacy and having the vaccine there.  Once this is completed, he should let us know so we can update our chart.  5. Need for influenza vaccination Flu vaccine given in office today. - Flu Vaccine QUAD 6+ mos PF IM (Fluarix Quad PF)  Return in about 6 weeks (around  02/02/2022) for for follow up on ED. ___________________________________________ Clearnce Sorrel, DNP, APRN, FNP-BC [facility] [place]

## 2021-12-22 ENCOUNTER — Encounter: Payer: Self-pay | Admitting: Medical-Surgical

## 2021-12-22 ENCOUNTER — Ambulatory Visit (INDEPENDENT_AMBULATORY_CARE_PROVIDER_SITE_OTHER): Payer: Medicare Other | Admitting: Medical-Surgical

## 2021-12-22 VITALS — BP 124/67 | HR 70 | Resp 20 | Ht 67.0 in | Wt 175.8 lb

## 2021-12-22 DIAGNOSIS — Z09 Encounter for follow-up examination after completed treatment for conditions other than malignant neoplasm: Secondary | ICD-10-CM

## 2021-12-22 DIAGNOSIS — Z23 Encounter for immunization: Secondary | ICD-10-CM

## 2021-12-22 DIAGNOSIS — N529 Male erectile dysfunction, unspecified: Secondary | ICD-10-CM

## 2021-12-22 DIAGNOSIS — E785 Hyperlipidemia, unspecified: Secondary | ICD-10-CM

## 2021-12-22 MED ORDER — ATORVASTATIN CALCIUM 20 MG PO TABS: 20.0000 mg | ORAL_TABLET | Freq: Every day | ORAL | 3 refills | Status: DC

## 2021-12-23 LAB — TESTOSTERONE TOTAL,FREE,BIO, MALES
Albumin: 4.4 g/dL (ref 3.6–5.1)
Sex Hormone Binding: 31 nmol/L (ref 22–77)
Testosterone, Bioavailable: 133.9 ng/dL (ref 110.0–575.0)
Testosterone, Free: 66.5 pg/mL (ref 46.0–224.0)
Testosterone: 469 ng/dL (ref 250–827)

## 2021-12-23 LAB — LIPID PANEL
Cholesterol: 153 mg/dL (ref <200)
HDL: 50 mg/dL (ref >=40)
LDL Cholesterol (Calc): 88 mg/dL (calc)
Non-HDL Cholesterol (Calc): 103 mg/dL (calc) (ref <130)
Total CHOL/HDL Ratio: 3.1 (calc) (ref <5.0)
Triglycerides: 65 mg/dL (ref <150)

## 2022-01-23 ENCOUNTER — Ambulatory Visit (INDEPENDENT_AMBULATORY_CARE_PROVIDER_SITE_OTHER): Payer: Medicare Other | Admitting: Medical-Surgical

## 2022-01-23 DIAGNOSIS — Z0000 Encounter for general adult medical examination without abnormal findings: Secondary | ICD-10-CM

## 2022-01-23 NOTE — Progress Notes (Signed)
MEDICARE ANNUAL WELLNESS VISIT  01/23/2022  Telephone Visit Disclaimer This Medicare AWV was conducted by telephone due to national recommendations for restrictions regarding the COVID-19 Pandemic (e.g. social distancing).  I verified, using two identifiers, that I am speaking with Louis Bennett or their authorized healthcare agent. I discussed the limitations, risks, security, and privacy concerns of performing an evaluation and management service by telephone and the potential availability of an in-person appointment in the future. The patient expressed understanding and agreed to proceed.  Location of Patient: Home Location of Provider (nurse):  in the office.  Subjective:    Louis Bennett is a 56 y.o. male patient of Christen Butter, NP who had a Medicare Annual Wellness Visit today via telephone. Louis Bennett is Legally disabled and lives with their partner. he has 5 children. he reports that he is socially active and does interact with friends/family regularly. he is moderately physically active and enjoys working out.  Patient Care Team: Christen Butter, NP as PCP - General (Nurse Practitioner)     01/23/2022    9:30 AM 01/10/2021    9:06 AM  Advanced Directives  Does Patient Have a Medical Advance Directive? No No  Would patient like information on creating a medical advance directive? No - Patient declined No - Patient declined    Hospital Utilization Over the Past 12 Months: # of hospitalizations or ER visits: 1 # of surgeries: 0  Review of Systems    Patient reports that his overall health is unchanged compared to last year.  History obtained from chart review and the patient  Patient Reported Readings (BP, Pulse, CBG, Weight, etc) none  Pain Assessment Pain : No/denies pain     Current Medications & Allergies (verified) Allergies as of 01/23/2022   No Known Allergies      Medication List        Accurate as of January 23, 2022  9:38 AM. If you have  any questions, ask your nurse or doctor.          abacavir-dolutegravir-lamiVUDine 600-50-300 MG tablet Commonly known as: TRIUMEQ Take 1 tablet by mouth every morning.   atorvastatin 20 MG tablet Commonly known as: LIPITOR Take 1 tablet (20 mg total) by mouth daily.   VITAMIN C PO Take by mouth daily.        History (reviewed): Past Medical History:  Diagnosis Date   Acute hypoxemic respiratory failure (HCC) 03/29/2020   Arthritis    CKD (chronic kidney disease) stage 3, GFR 30-59 ml/min (HCC)    GERD (gastroesophageal reflux disease)    HIV positive (HCC)    Pneumonia due to COVID-19 virus 03/27/2020   Right lumbar radiculitis    History reviewed. No pertinent surgical history. Family History  Problem Relation Age of Onset   Hypertension Mother    Diabetes Mother    Arthritis Mother    Obesity Mother    Heart failure Brother    Hypertension Brother    Stroke Brother    Early death Brother    Hypertension Brother    Stroke Brother    Social History   Socioeconomic History   Marital status: Significant Other    Spouse name: Louis Bennett   Number of children: 5   Years of education: 12   Highest education level: 12th grade  Occupational History   Occupation: Legally Disabled  Tobacco Use   Smoking status: Former    Packs/day: 0.25    Years: 15.00    Total pack years:  3.75    Types: Cigarettes    Quit date: 01/28/2020    Years since quitting: 1.9   Smokeless tobacco: Never   Tobacco comments:    I quit 2 yrs ago  Vaping Use   Vaping Use: Former   Substances: Nicotine  Substance and Sexual Activity   Alcohol use: Not Currently    Comment: Rarely   Drug use: Not Currently    Types: Marijuana   Sexual activity: Yes    Partners: Female    Birth control/protection: Condom, None  Other Topics Concern   Not on file  Social History Narrative   Lives with significant other, Louis Bennett. He has five children. He enjoys working out.    Social Determinants  of Health   Financial Resource Strain: Low Risk  (01/19/2022)   Overall Financial Resource Strain (CARDIA)    Difficulty of Paying Living Expenses: Not hard at all  Food Insecurity: No Food Insecurity (01/19/2022)   Hunger Vital Sign    Worried About Running Out of Food in the Last Year: Never true    Ran Out of Food in the Last Year: Never true  Transportation Needs: Unmet Transportation Needs (01/19/2022)   PRAPARE - Administrator, Civil Service (Medical): Yes    Lack of Transportation (Non-Medical): Yes  Physical Activity: Insufficiently Active (01/19/2022)   Exercise Vital Sign    Days of Exercise per Week: 4 days    Minutes of Exercise per Session: 10 min  Stress: No Stress Concern Present (01/19/2022)   [employer] of Occupational Health - Occupational Stress Questionnaire    Feeling of Stress : Only a little  Social Connections: Moderately Integrated (01/23/2022)   Social Connection and Isolation Panel [NHANES]    Frequency of Communication with Friends and Family: Three times a week    Frequency of Social Gatherings with Friends and Family: Once a week    Attends Religious Services: More than 4 times per year    Active Member of [employer] or Organizations: No    Attends Banker Meetings: Never    Marital Status: Living with partner    Activities of Daily Living    01/19/2022    1:07 PM  In your present state of health, do you have any difficulty performing the following activities:  Hearing? 0  Vision? 0  Difficulty concentrating or making decisions? 0  Walking or climbing stairs? 0  Dressing or bathing? 0  Doing errands, shopping? 0  Preparing Food and eating ? N  Using the Toilet? N  In the past six months, have you accidently leaked urine? N  Do you have problems with loss of bowel control? N  Managing your Medications? N  Managing your Finances? N  Housekeeping or managing your Housekeeping? N    Patient Education/ Literacy How  often do you need to have someone help you when you read instructions, pamphlets, or other written materials from your doctor or pharmacy?: 1 - Never What is the last grade level you completed in school?: 12th grade  Exercise Current Exercise Habits: The patient does not participate in regular exercise at present, Exercise limited by: cardiac condition(s)  Diet Patient reports consuming 3 meals a day and 4 snack(s) a day Patient reports that his primary diet is: Regular Patient reports that she does have regular access to food.   Depression Screen    01/23/2022    9:28 AM 12/22/2021   11:42 AM 01/10/2021    9:07 AM 09/28/2019  10:21 AM  PHQ 2/9 Scores  PHQ - 2 Score 0 0 0 0  PHQ- 9 Score    0     Fall Risk    01/23/2022    9:28 AM 01/19/2022    1:07 PM 01/10/2021    9:07 AM 09/28/2019   10:21 AM  [place] in the past year? 0 0 0 0  Number falls in past yr: 0 0 0   Injury with Fall? 0 0 0   Risk for fall due to : No Fall Risks  No Fall Risks   Follow up Falls evaluation completed  Falls evaluation completed Falls evaluation completed     Objective:  Louis Bennett seemed alert and oriented and he participated appropriately during our telephone visit.  Blood Pressure Weight BMI  BP Readings from Last 3 Encounters:  12/22/21 124/67  01/31/21 116/81  01/15/21 119/81   Wt Readings from Last 3 Encounters:  12/22/21 175 lb 12.8 oz (79.7 kg)  01/31/21 185 lb 0.6 oz (83.9 kg)  01/15/21 184 lb (83.5 kg)   BMI Readings from Last 1 Encounters:  12/22/21 27.53 kg/m    *Unable to obtain current vital signs, weight, and BMI due to telephone visit type  Hearing/Vision  Keshaun did not seem to have difficulty with hearing/understanding during the telephone conversation Reports that he has not had a formal eye exam by an eye care professional within the past year Reports that he has not had a formal hearing evaluation within the past year *Unable to fully assess  hearing and vision during telephone visit type  Cognitive Function:    01/23/2022    9:30 AM 01/10/2021    9:14 AM  6CIT Screen  What Year? 0 points 0 points  What month? 0 points 0 points  What time? 0 points 0 points  Count back from 20 0 points 0 points  Months in reverse 0 points 0 points  Repeat phrase 0 points 0 points  Total Score 0 points 0 points   (Normal:0-7, Significant for Dysfunction: >8)  Normal Cognitive Function Screening: Yes   Immunization & Health Maintenance Record Immunization History  Administered Date(s) Administered   DT (Pediatric) 01/28/1971   DTP 09/10/1964, 10/15/1964, 11/16/1964   Hep A / Hep B 04/15/2012, 08/12/2012, 11/25/2012   Influenza Split 04/11/2013, 03/14/2018, 01/17/2019, 01/11/2020   Influenza,inj,Quad PF,6+ Mos 01/11/2020, 01/15/2021, 12/22/2021   Influenza-Unspecified 04/11/2013, 03/14/2018, 01/17/2019, 01/11/2020   Measles 12/17/1968   Measles / Rubella 06/28/1970   OPV 09/10/1964, 11/16/1964, 01/28/1971   PFIZER Comirnaty(Gray Top)Covid-19 Tri-Sucrose Vaccine 05/06/2020, 05/27/2020   PNEUMOCOCCAL CONJUGATE-20 11/26/2021   PPD Test 07/13/2011, 08/12/2012   Pneumococcal Conjugate-13 07/13/2011   Pneumococcal Polysaccharide-23 04/15/2012, 03/14/2018   Rubella 12/24/1968   Tdap 04/29/2011    Health Maintenance  Topic Date Due   COVID-19 Vaccine (3 - Pfizer risk series) 02/08/2022 (Originally 06/24/2020)   Zoster Vaccines- Shingrix (1 of 2) 03/23/2022 (Originally 12/29/1982)   TETANUS/TDAP  12/23/2022 (Originally 04/28/2021)   COLONOSCOPY (Pts 45-73yrs Insurance coverage will need to be confirmed)  01/24/2023 (Originally 12/28/2008)   Medicare Annual Wellness (AWV)  01/24/2023   INFLUENZA VACCINE  Completed   Hepatitis C Screening  Completed   HIV Screening  Completed   HPV VACCINES  Aged Out       Assessment  This is a routine wellness examination for [employer].  Health Maintenance: Due or Overdue There are no  preventive care reminders to display for this patient.  Louis Bennett does not need a referral for [employer] Assistance: Care Management:   no Social Work:    no Prescription Assistance:  no Nutrition/Diabetes Education:  no   Plan:  Personalized Goals  Goals Addressed               This Visit's Progress     Patient Stated (pt-stated)        Getting his driver's license back.       Personalized Health Maintenance & Screening Recommendations  Td vaccine Shingles vaccine Colorectal cancer screening- declined referral  Lung Cancer Screening Recommended: yes; declined at this time. (Low Dose CT Chest recommended if Age 34-80 years, 30 pack-year currently smoking OR have quit w/in past 15 years) Hepatitis C Screening recommended: no HIV Screening recommended: no  Advanced Directives: Written information was not prepared per patient's request.  Referrals & Orders No orders of the defined types were placed in this encounter.   Follow-up Plan Follow-up with Christen Butter, NP as planned Schedule your tetanus and shingles vaccine at your pharmacy. Medicare wellness visit in one year.  Patient will access AVS on my chart.   I have personally reviewed and noted the following in the patient's chart:   Medical and social history Use of alcohol, tobacco or illicit drugs  Current medications and supplements Functional ability and status Nutritional status Physical activity Advanced directives List of other physicians Hospitalizations, surgeries, and ER visits in previous 12 months Vitals Screenings to include cognitive, depression, and falls Referrals and appointments  In addition, I have reviewed and discussed with Louis Bennett certain preventive protocols, quality metrics, and best practice recommendations. A written personalized care plan for preventive services as well as general preventive health recommendations is available and can be mailed to the  patient at his request.      Modesto Charon, RN BSN  01/23/2022

## 2022-01-23 NOTE — Patient Instructions (Addendum)
MEDICARE ANNUAL WELLNESS VISIT Health Maintenance Summary and Written Plan of Care  [name] ,  Thank you for allowing me to perform your Medicare Annual Wellness Visit and for your ongoing commitment to your health.   Health Maintenance & Immunization History Health Maintenance  Topic Date Due   COVID-19 Vaccine (3 - Pfizer risk series) 02/08/2022 (Originally 06/24/2020)   Zoster Vaccines- Shingrix (1 of 2) 03/23/2022 (Originally 12/29/1982)   TETANUS/TDAP  12/23/2022 (Originally 04/28/2021)   COLONOSCOPY (Pts 45-18yrs Insurance coverage will need to be confirmed)  01/24/2023 (Originally 12/28/2008)   Medicare Annual Wellness (AWV)  01/24/2023   INFLUENZA VACCINE  Completed   Hepatitis C Screening  Completed   HIV Screening  Completed   HPV VACCINES  Aged Out   Immunization History  Administered Date(s) Administered   DT (Pediatric) 01/28/1971   DTP 09/10/1964, 10/15/1964, 11/16/1964   Hep A / Hep B 04/15/2012, 08/12/2012, 11/25/2012   Influenza Split 04/11/2013, 03/14/2018, 01/17/2019, 01/11/2020   Influenza,inj,Quad PF,6+ Mos 01/11/2020, 01/15/2021, 12/22/2021   Influenza-Unspecified 04/11/2013, 03/14/2018, 01/17/2019, 01/11/2020   Measles 12/17/1968   Measles / Rubella 06/28/1970   OPV 09/10/1964, 11/16/1964, 01/28/1971   PFIZER Comirnaty(Gray Top)Covid-19 Tri-Sucrose Vaccine 05/06/2020, 05/27/2020   PNEUMOCOCCAL CONJUGATE-20 11/26/2021   PPD Test 07/13/2011, 08/12/2012   Pneumococcal Conjugate-13 07/13/2011   Pneumococcal Polysaccharide-23 04/15/2012, 03/14/2018   Rubella 12/24/1968   Tdap 04/29/2011    These are the patient goals that we discussed:  Goals Addressed               This Visit's Progress     Patient Stated (pt-stated)        Getting his driver's license back.         This is a list of Health Maintenance Items that are overdue or due now: Td vaccine Shingles vaccine Colorectal cancer screening- declined referral    Orders/Referrals  Placed Today: No orders of the defined types were placed in this encounter.  (Contact our referral department at [phone] if you have not spoken with someone about your referral appointment within the next 5 days)    Follow-up Plan Follow-up with Christen Butter, NP as planned Schedule your tetanus and shingles vaccine at your pharmacy. Medicare wellness visit in one year.  Patient will access AVS on my chart.      Health Maintenance, Male Adopting a healthy lifestyle and getting preventive care are important in promoting health and wellness. Ask your health care provider about: The right schedule for you to have regular tests and exams. Things you can do on your own to prevent diseases and keep yourself healthy. What should I know about diet, weight, and exercise? Eat a healthy diet  Eat a diet that includes plenty of vegetables, fruits, low-fat dairy products, and lean protein. Do not eat a lot of foods that are high in solid fats, added sugars, or sodium. Maintain a healthy weight Body mass index (BMI) is a measurement that can be used to identify possible weight problems. It estimates body fat based on height and weight. Your health care provider can help determine your BMI and help you achieve or maintain a healthy weight. Get regular exercise Get regular exercise. This is one of the most important things you can do for your health. Most adults should: Exercise for at least 150 minutes each week. The exercise should increase your heart rate and make you sweat (moderate-intensity exercise). Do strengthening exercises at least twice a week. This is in addition to the moderate-intensity  exercise. Spend less time sitting. Even light physical activity can be beneficial. Watch cholesterol and blood lipids Have your blood tested for lipids and cholesterol at 58 years of age, then have this test every 5 years. You may need to have your cholesterol levels checked more often if: Your  lipid or cholesterol levels are high. You are older than 58 years of age. You are at high risk for heart disease. What should I know about cancer screening? Many types of cancers can be detected early and may often be prevented. Depending on your health history and family history, you may need to have cancer screening at various ages. This may include screening for: Colorectal cancer. Prostate cancer. Skin cancer. Lung cancer. What should I know about heart disease, diabetes, and high blood pressure? Blood pressure and heart disease High blood pressure causes heart disease and increases the risk of stroke. This is more likely to develop in people who have high blood pressure readings or are overweight. Talk with your health care provider about your target blood pressure readings. Have your blood pressure checked: Every 3-5 years if you are 17-4 years of age. Every year if you are 41 years old or older. If you are between the ages of 76 and 23 and are a current or former smoker, ask your health care provider if you should have a one-time screening for abdominal aortic aneurysm (AAA). Diabetes Have regular diabetes screenings. This checks your fasting blood sugar level. Have the screening done: Once every three years after age 13 if you are at a normal weight and have a low risk for diabetes. More often and at a younger age if you are overweight or have a high risk for diabetes. What should I know about preventing infection? Hepatitis B If you have a higher risk for hepatitis B, you should be screened for this virus. Talk with your health care provider to find out if you are at risk for hepatitis B infection. Hepatitis C Blood testing is recommended for: Everyone born from 1 through 1967. Anyone with known risk factors for hepatitis C. Sexually transmitted infections (STIs) You should be screened each year for STIs, including gonorrhea and chlamydia, if: You are sexually active and  are younger than 58 years of age. You are older than 58 years of age and your health care provider tells you that you are at risk for this type of infection. Your sexual activity has changed since you were last screened, and you are at increased risk for chlamydia or gonorrhea. Ask your health care provider if you are at risk. Ask your health care provider about whether you are at high risk for HIV. Your health care provider may recommend a prescription medicine to help prevent HIV infection. If you choose to take medicine to prevent HIV, you should first get tested for HIV. You should then be tested every 3 months for as long as you are taking the medicine. Follow these instructions at home: Alcohol use Do not drink alcohol if your health care provider tells you not to drink. If you drink alcohol: Limit how much you have to 0-2 drinks a day. Know how much alcohol is in your drink. In the [place] one drink equals one 12 oz bottle of beer (355 mL), one 5 oz glass of wine (148 mL), or one 1 oz glass of hard liquor (44 mL). Lifestyle Do not use any products that contain nicotine or tobacco. These products include cigarettes, chewing tobacco, and vaping  devices, such as e-cigarettes. If you need help quitting, ask your health care provider. Do not use street drugs. Do not share needles. Ask your health care provider for help if you need support or information about quitting drugs. General instructions Schedule regular health, dental, and eye exams. Stay current with your vaccines. Tell your health care provider if: You often feel depressed. You have ever been abused or do not feel safe at home. Summary Adopting a healthy lifestyle and getting preventive care are important in promoting health and wellness. Follow your health care provider's instructions about healthy diet, exercising, and getting tested or screened for diseases. Follow your health care provider's instructions on monitoring your  cholesterol and blood pressure. This information is not intended to replace advice given to you by your health care provider. Make sure you discuss any questions you have with your health care provider. Document Revised: 08/05/2020 Document Reviewed: 08/05/2020 Elsevier Patient Education  [place].

## 2022-02-01 NOTE — Progress Notes (Deleted)
   Established Patient Office Visit  Subjective   Patient ID: Louis Bennett, male   DOB: 05-26-1965 Age: 56 y.o. MRN: [id number]   No chief complaint on file.   HPI    Objective:    There were no vitals filed for this visit.  Physical Exam   No results found for this or any previous visit (from the past 24 hour(s)).   {Labs (Optional):23779}  The 10-year ASCVD risk score (Arnett DK, et al., 2019) is: 8.9%   Values used to calculate the score:     Age: 65 years     Sex: Male     Is Non-Hispanic African American: Yes     Diabetic: No     Tobacco smoker: No     Systolic Blood Pressure: 409 mmHg     Is BP treated: No     HDL Cholesterol: 50 mg/dL     Total Cholesterol: 153 mg/dL   Assessment & Plan:   No problem-specific Assessment & Plan notes found for this encounter.   No follow-ups on file.  ___________________________________________ Clearnce Sorrel, DNP, APRN, FNP-BC Primary [facility] and [place]

## 2022-02-02 ENCOUNTER — Encounter: Payer: Self-pay | Admitting: Medical-Surgical

## 2022-02-02 ENCOUNTER — Ambulatory Visit (INDEPENDENT_AMBULATORY_CARE_PROVIDER_SITE_OTHER): Payer: Medicare Other | Admitting: Medical-Surgical

## 2022-02-02 VITALS — BP 122/74 | HR 84 | Ht 67.0 in | Wt 177.0 lb

## 2022-02-02 DIAGNOSIS — N529 Male erectile dysfunction, unspecified: Secondary | ICD-10-CM

## 2022-02-02 MED ORDER — SILDENAFIL CITRATE 20 MG PO TABS: 20.0000 mg | ORAL_TABLET | ORAL | 11 refills | Status: DC | PRN

## 2022-02-02 NOTE — Progress Notes (Signed)
Medical screening examination/treatment was performed by qualified nurse practitioner student and as supervising provider I was immediately available for consultation/collaboration. I have reviewed documentation and agree with assessment and plan. ° °Guerry Covington L. Namir Neto, DNP, APRN, FNP-BC °[facility] °Primary Care and Sports Medicine ° °

## 2022-02-02 NOTE — Progress Notes (Signed)
   Established Patient Office Visit  Subjective   Patient ID: Zaydan Papesh, male    DOB: Aug 18, 1965  Age: 56 y.o. MRN: [id number]  Chief Complaint  Patient presents with   Follow-up    HPI  Annalee Genta is a very pleasant 56 year old male with chief complaint today of erectile dysfunction. He reports difficulty getting a full erection or if he does, it does not last. He denies any alcohol intake. He reports no issue with relationship with current partner. He reports getting decent amount of rest. He stated that he used Viagra in the past and tolerated this alright.   HTN: Diet and physical activity controlled. Follow up appointment with cardiology next week.   HLD: on Atorvastatin 20 mg, daily. No voiced complaints.  HIV: Compliant with meds and follows up closely with ID.   Review of Systems  Constitutional: Negative.   Respiratory: Negative.    Cardiovascular: Negative.   Genitourinary: Negative.   Musculoskeletal: Negative.   Skin: Negative.   Neurological: Negative.   Psychiatric/Behavioral: Negative.        Objective:     BP 122/74   Pulse 84   Ht 5\' 7"  (1.702 m)   Wt 80.3 kg   SpO2 98%   BMI 27.72 kg/m    Physical Exam Constitutional:      Appearance: Normal appearance. He is obese.  HENT:     Head: Normocephalic and atraumatic.  Eyes:     General: No scleral icterus. Cardiovascular:     Rate and Rhythm: Normal rate and regular rhythm.     Pulses: Normal pulses.     Heart sounds: Normal heart sounds.  Pulmonary:     Effort: Pulmonary effort is normal.     Breath sounds: Normal breath sounds.  Abdominal:     General: Abdomen is flat.  Musculoskeletal:        General: No swelling.  Skin:    General: Skin is warm and dry.     Capillary Refill: Capillary refill takes less than 2 seconds.  Neurological:     General: No focal deficit present.     Mental Status: He is alert and oriented to person, place, and time. Mental status is at baseline.   Psychiatric:        Mood and Affect: Mood normal.        Behavior: Behavior normal.        Thought Content: Thought content normal.        Judgment: Judgment normal.      No results found for any visits on 02/02/22.    The 10-year ASCVD risk score (Arnett DK, et al., 2019) is: 6.4%    Assessment & Plan:   1. Erectile dysfunction, unspecified erectile dysfunction type We will start him sildenafil (REVATIO) 20 mg , 1-5 tables as needed. We discussed at length the potential side effects and to stop the medication for any chest pain, shortness of breath and or dizziness as well as erection lasting for more than 4 hours.   2. High Blood Pressure Blood pressure reading at goal today, continue with proper heart healthy diet and exercise.   3. Hyperlipidemia Continue with atorvastatin 20 mg daily and to consume diet low in trans and saturated fat content.   4. HIV (human immunodeficiency virus infection) Will follow up closely with ID.   Return in about 6 months (around 08/03/2022) for HTN/HLD follow up.    Ralph Leyden, RN Student NP

## 2022-08-03 ENCOUNTER — Encounter: Payer: Self-pay | Admitting: Medical-Surgical

## 2022-08-03 ENCOUNTER — Ambulatory Visit (INDEPENDENT_AMBULATORY_CARE_PROVIDER_SITE_OTHER): Payer: 59 | Admitting: Medical-Surgical

## 2022-08-03 VITALS — BP 128/75 | HR 69 | Resp 20 | Ht 67.0 in | Wt 185.0 lb

## 2022-08-03 DIAGNOSIS — R7303 Prediabetes: Secondary | ICD-10-CM

## 2022-08-03 DIAGNOSIS — E785 Hyperlipidemia, unspecified: Secondary | ICD-10-CM

## 2022-08-03 DIAGNOSIS — N183 Chronic kidney disease, stage 3 unspecified: Secondary | ICD-10-CM | POA: Diagnosis not present

## 2022-08-03 DIAGNOSIS — B20 Human immunodeficiency virus [HIV] disease: Secondary | ICD-10-CM | POA: Diagnosis not present

## 2022-08-03 NOTE — Progress Notes (Signed)
        Established patient visit  History, exam, impression, and plan:  1. Hyperlipidemia, unspecified hyperlipidemia type Pleasant 57 year old male presenting today with hyperlipidemia currently treated with atorvastatin 20 mg daily.  Tolerating the medication well without side effects.  Exercising regularly and follows a low-fat heart healthy diet.  Checking lipids today.  Continue atorvastatin as prescribed. - Lipid panel  2. Prediabetes History of prediabetes with last A1c at 6.1%.  Rechecking A1c today. - Hemoglobin A1c  3. HIV infection, unspecified symptom status (HCC) Managed by infectious disease with [facility].  4. Stage 3 chronic kidney disease, unspecified whether stage 3a or 3b CKD (HCC) Most recent labs checked showed a mildly elevated creatinine of 1.37 however he regularly participates in heavy exercise and weightlifting.  GFR at 60 showing stable kidney function.  Continue to avoid NSAIDs.  Stay well-hydrated.  Procedures performed this visit: None.  Return in about 6 months (around 02/03/2023) for annual physical exam.  __________________________________ Thayer Ohm, DNP, APRN, FNP-BC Primary Care and Sports Medicine [facility]

## 2022-08-04 LAB — LIPID PANEL
Cholesterol: 153 mg/dL (ref <200)
HDL: 52 mg/dL (ref >=40)
LDL Cholesterol (Calc): 87 mg/dL (calc)
Non-HDL Cholesterol (Calc): 101 mg/dL (calc) (ref <130)
Total CHOL/HDL Ratio: 2.9 (calc) (ref <5.0)
Triglycerides: 62 mg/dL (ref <150)

## 2022-08-04 LAB — HEMOGLOBIN A1C
Hgb A1c MFr Bld: 6.2 % of total Hgb — ABNORMAL HIGH (ref <5.7)
Mean Plasma Glucose: 131 mg/dL
eAG (mmol/L): 7.3 mmol/L

## 2023-01-07 ENCOUNTER — Encounter: Payer: Self-pay | Admitting: Medical-Surgical

## 2023-01-11 ENCOUNTER — Encounter: Payer: Self-pay | Admitting: Medical-Surgical

## 2023-01-11 ENCOUNTER — Ambulatory Visit: Payer: 59

## 2023-01-11 ENCOUNTER — Ambulatory Visit (INDEPENDENT_AMBULATORY_CARE_PROVIDER_SITE_OTHER): Payer: 59 | Admitting: Medical-Surgical

## 2023-01-11 VITALS — BP 128/77 | HR 77 | Resp 20 | Ht 67.0 in | Wt 182.8 lb

## 2023-01-11 DIAGNOSIS — N183 Chronic kidney disease, stage 3 unspecified: Secondary | ICD-10-CM

## 2023-01-11 DIAGNOSIS — M542 Cervicalgia: Secondary | ICD-10-CM

## 2023-01-11 DIAGNOSIS — R030 Elevated blood-pressure reading, without diagnosis of hypertension: Secondary | ICD-10-CM

## 2023-01-11 DIAGNOSIS — R7303 Prediabetes: Secondary | ICD-10-CM

## 2023-01-11 DIAGNOSIS — M47812 Spondylosis without myelopathy or radiculopathy, cervical region: Secondary | ICD-10-CM | POA: Diagnosis not present

## 2023-01-11 DIAGNOSIS — M4802 Spinal stenosis, cervical region: Secondary | ICD-10-CM | POA: Diagnosis not present

## 2023-01-11 MED ORDER — MELOXICAM 15 MG PO TABS
15.0000 mg | ORAL_TABLET | Freq: Every day | ORAL | 0 refills | Status: DC
Start: 2023-01-11 — End: 2023-01-27

## 2023-01-11 MED ORDER — SILDENAFIL CITRATE 20 MG PO TABS: 20.0000 mg | ORAL_TABLET | ORAL | 11 refills | Status: AC | PRN

## 2023-01-11 MED ORDER — ATORVASTATIN CALCIUM 20 MG PO TABS: 20.0000 mg | ORAL_TABLET | Freq: Every day | ORAL | 3 refills | Status: DC

## 2023-01-11 MED ORDER — PREDNISONE 20 MG PO TABS
40.0000 mg | ORAL_TABLET | Freq: Every day | ORAL | 0 refills | Status: DC
Start: 2023-01-11 — End: 2023-01-27

## 2023-01-11 NOTE — Progress Notes (Signed)
        Established patient visit  History, exam, impression, and plan:  1. Elevated blood pressure reading Pleasant 57 year old male presenting today with reports of recent elevation in blood pressure.  Does not regularly check blood pressure at home however he had an episode where his neck was painful on the right side.  Shortly after it moved to the right side of his head and was throbbing.  He had very worried about this and became anxious.  They called EMS who came out and did an evaluation.  At the time, his blood pressure was elevated however his exam was overall reassuring.  He decided not to go to the emergency room.  Since then reports that his headache has resolved and wonders if his elevated blood pressure may be related to the headache, neck pain, or anxiety.  On evaluation today, his blood pressure is normal at 128/77.  Discussed the various causes of transient elevation of blood pressure.  Suspect this was an incidental finding in the setting of anxiety, headache, and neck pain.  Recommend monitoring blood pressure at home with a goal of 130/80 or less.  If consistently elevated, return for further evaluation and management discussions.  2. Prediabetes History of prediabetes.  He is not checking blood sugars at home.  Plan to check A1c today. - Hemoglobin A1c  3. Stage 3 chronic kidney disease, unspecified whether stage 3a or 3b CKD (HCC) History of chronic kidney disease stage III.  He admits that he has been taking some ibuprofen lately but tries to limit this as much as possible.  Checking labs as below. - CMP14+EGFR - CBC with Differential/Platelet  4. Cervicalgia Has had some neck pain as noted above.  This seems to be most present along the midline and he reports that he has had some unusual popping and clicking.  Bilateral paraspinal muscle tenderness.  On palpation of the midline cervical spine, no bulging or step-offs noted.  Admits that he has been doing some exercises in  the gym that involve over the head lifting and feels that he may have hurt his neck while exercising.  Since this is still bothering him after a couple of weeks, plan to get cervical spine x-rays.  Exercises provided for home completion.  Advised no over the head/shoulder height exercises and to seek modifications.  Adding prednisone burst of 40 mg daily for 5 days.  Sending meloxicam 15 mg daily thereafter however suggest very sparing use of this due to kidney disease noted above.  If no improvement with conservative measures at home and home exercises after 6 weeks, return for further evaluation. - DG Cervical Spine Complete; Future  Procedures performed this visit: None.  Return for neck pain in 6 weeks if no better; 6 months for chronic disease follow up .  __________________________________ Thayer Ohm, DNP, APRN, FNP-BC Primary Care and Sports Medicine [facility] [place]

## 2023-01-12 LAB — CBC WITH DIFFERENTIAL/PLATELET
Basophils Absolute: 0 10*3/uL (ref 0.0–0.2)
Basos: 1 %
EOS (ABSOLUTE): 0.1 10*3/uL (ref 0.0–0.4)
Eos: 2 %
Hematocrit: 41.1 % (ref 37.5–51.0)
Hemoglobin: 13.4 g/dL (ref 13.0–17.7)
Immature Grans (Abs): 0 10*3/uL (ref 0.0–0.1)
Immature Granulocytes: 0 %
Lymphocytes Absolute: 1.1 10*3/uL (ref 0.7–3.1)
Lymphs: 33 %
MCH: 30.8 pg (ref 26.6–33.0)
MCHC: 32.6 g/dL (ref 31.5–35.7)
MCV: 95 fL (ref 79–97)
Monocytes Absolute: 0.3 10*3/uL (ref 0.1–0.9)
Monocytes: 8 %
Neutrophils Absolute: 1.9 10*3/uL (ref 1.4–7.0)
Neutrophils: 56 %
Platelets: 241 10*3/uL (ref 150–450)
RBC: 4.35 x10E6/uL (ref 4.14–5.80)
RDW: 12.9 % (ref 11.6–15.4)
WBC: 3.3 10*3/uL — ABNORMAL LOW (ref 3.4–10.8)

## 2023-01-12 LAB — CMP14+EGFR
ALT: 23 [IU]/L (ref 0–44)
AST: 18 [IU]/L (ref 0–40)
Albumin: 4.4 g/dL (ref 3.8–4.9)
Alkaline Phosphatase: 67 [IU]/L (ref 44–121)
BUN/Creatinine Ratio: 12 (ref 9–20)
BUN: 16 mg/dL (ref 6–24)
Bilirubin Total: 0.3 mg/dL (ref 0.0–1.2)
CO2: 24 mmol/L (ref 20–29)
Calcium: 9.7 mg/dL (ref 8.7–10.2)
Chloride: 102 mmol/L (ref 96–106)
Creatinine, Ser: 1.35 mg/dL — ABNORMAL HIGH (ref 0.76–1.27)
Globulin, Total: 2.6 g/dL (ref 1.5–4.5)
Glucose: 89 mg/dL (ref 70–99)
Potassium: 4.6 mmol/L (ref 3.5–5.2)
Sodium: 140 mmol/L (ref 134–144)
Total Protein: 7 g/dL (ref 6.0–8.5)
eGFR: 60 mL/min/{1.73_m2} (ref >59)

## 2023-01-12 LAB — HEMOGLOBIN A1C
Est. average glucose Bld gHb Est-mCnc: 137 mg/dL
Hgb A1c MFr Bld: 6.4 % — ABNORMAL HIGH (ref 4.8–5.6)

## 2023-01-24 ENCOUNTER — Encounter: Payer: Self-pay | Admitting: Medical-Surgical

## 2023-01-24 DIAGNOSIS — R9431 Abnormal electrocardiogram [ECG] [EKG]: Secondary | ICD-10-CM | POA: Diagnosis not present

## 2023-01-24 DIAGNOSIS — Z87891 Personal history of nicotine dependence: Secondary | ICD-10-CM | POA: Diagnosis not present

## 2023-01-24 DIAGNOSIS — R079 Chest pain, unspecified: Secondary | ICD-10-CM | POA: Diagnosis not present

## 2023-01-24 DIAGNOSIS — R7989 Other specified abnormal findings of blood chemistry: Secondary | ICD-10-CM | POA: Diagnosis not present

## 2023-01-24 DIAGNOSIS — R0789 Other chest pain: Secondary | ICD-10-CM | POA: Diagnosis not present

## 2023-01-24 DIAGNOSIS — R7303 Prediabetes: Secondary | ICD-10-CM

## 2023-01-24 DIAGNOSIS — Z79899 Other long term (current) drug therapy: Secondary | ICD-10-CM | POA: Diagnosis not present

## 2023-01-24 DIAGNOSIS — R002 Palpitations: Secondary | ICD-10-CM | POA: Diagnosis not present

## 2023-01-26 MED ORDER — METFORMIN HCL ER 500 MG PO TB24
500.0000 mg | ORAL_TABLET | Freq: Every day | ORAL | 1 refills | Status: DC
Start: 2023-01-26 — End: 2023-07-21

## 2023-01-27 ENCOUNTER — Telehealth: Payer: Self-pay | Admitting: General Practice

## 2023-01-27 ENCOUNTER — Encounter: Payer: Self-pay | Admitting: Medical-Surgical

## 2023-01-27 ENCOUNTER — Ambulatory Visit (INDEPENDENT_AMBULATORY_CARE_PROVIDER_SITE_OTHER): Payer: 59 | Admitting: Medical-Surgical

## 2023-01-27 DIAGNOSIS — I10 Essential (primary) hypertension: Secondary | ICD-10-CM | POA: Diagnosis not present

## 2023-01-27 DIAGNOSIS — Z0000 Encounter for general adult medical examination without abnormal findings: Secondary | ICD-10-CM

## 2023-01-27 NOTE — Patient Instructions (Addendum)
MEDICARE ANNUAL WELLNESS VISIT Health Maintenance Summary and Written Plan of Care  [name] ,  Thank you for allowing me to perform your Medicare Annual Wellness Visit and for your ongoing commitment to your health.   Health Maintenance & Immunization History Health Maintenance  Topic Date Due   DTaP/Tdap/Td (6 - Td or Tdap) 04/28/2021   COVID-19 Vaccine (3 - Pfizer risk series) 01/27/2023 (Originally 06/24/2020)   Zoster Vaccines- Shingrix (1 of 2) 04/29/2023 (Originally 12/29/1982)   INFLUENZA VACCINE  06/28/2023 (Originally 10/29/2022)   Colonoscopy  01/27/2024 (Originally 12/28/2008)   Medicare Annual Wellness (AWV)  01/27/2024   Hepatitis C Screening  Completed   HIV Screening  Completed   HPV VACCINES  Aged Out   Immunization History  Administered Date(s) Administered   DT (Pediatric) 01/28/1971   DTP 09/10/1964, 10/15/1964, 11/16/1964   Hep A / Hep B 04/15/2012, 08/12/2012, 11/25/2012   Influenza Split 04/11/2013, 03/14/2018, 01/17/2019, 01/11/2020   Influenza,inj,Quad PF,6+ Mos 03/14/2018, 01/17/2019, 01/11/2020, 01/15/2021, 12/22/2021   Influenza-Unspecified 04/11/2013, 03/14/2018, 01/17/2019, 01/11/2020   Measles 12/17/1968   Measles / Rubella 06/28/1970   OPV 09/10/1964, 11/16/1964, 01/28/1971   PFIZER Comirnaty(Gray Top)Covid-19 Tri-Sucrose Vaccine 05/06/2020, 05/27/2020   PNEUMOCOCCAL CONJUGATE-20 11/26/2021   PPD Test 07/13/2011, 08/12/2012   Pneumococcal Conjugate-13 07/13/2011   Pneumococcal Polysaccharide-23 04/15/2012, 03/14/2018   Rubella 12/24/1968   Tdap 04/29/2011    These are the patient goals that we discussed:  Goals Addressed               This Visit's Progress     Patient Stated (pt-stated)        01/27/2023 AWV Goal: Exercise for General Health  Patient will verbalize understanding of the benefits of increased physical activity: Exercising regularly is important. It will improve your overall fitness, flexibility, and  endurance. Regular exercise also will improve your overall health. It can help you control your weight, reduce stress, and improve your bone density. Over the next year, patient will increase physical activity as tolerated with a goal of at least 150 minutes of moderate physical activity per week.  You can tell that you are exercising at a moderate intensity if your heart starts beating faster and you start breathing faster but can still hold a conversation. Moderate-intensity exercise ideas include: Walking 1 mile (1.6 km) in about 15 minutes Biking Hiking Golfing Dancing Water aerobics Patient will verbalize understanding of everyday activities that increase physical activity by providing examples like the following: Yard work, such as: Insurance underwriter Gardening Washing windows or floors Patient will be able to explain general safety guidelines for exercising:  Before you start a new exercise program, talk with your health care provider. Do not exercise so much that you hurt yourself, feel dizzy, or get very short of breath. Wear comfortable clothes and wear shoes with good support. Drink plenty of water while you exercise to prevent dehydration or heat stroke. Work out until your breathing and your heartbeat get faster.          This is a list of Health Maintenance Items that are overdue or due now: Td vaccine Shingles vaccine Influenza vaccine Colonoscopy  Orders/Referrals Placed Today: No orders of the defined types were placed in this encounter.  (Contact our referral department at [phone] if you have not spoken with someone about your referral appointment within the next 5 days)    Follow-up Plan Follow-up with Christen Butter,  NP as planned Schedule td vaccine and shingles vaccine at the pharmacy. Influenza vaccine can be done at the pharmacy or in office.  Please update the  office regarding colorectal cancer screening. Medicare wellness visit in one year.  Patient will access AVS on my chart.      Health Maintenance, Male Adopting a healthy lifestyle and getting preventive care are important in promoting health and wellness. Ask your health care provider about: The right schedule for you to have regular tests and exams. Things you can do on your own to prevent diseases and keep yourself healthy. What should I know about diet, weight, and exercise? Eat a healthy diet  Eat a diet that includes plenty of vegetables, fruits, low-fat dairy products, and lean protein. Do not eat a lot of foods that are high in solid fats, added sugars, or sodium. Maintain a healthy weight Body mass index (BMI) is a measurement that can be used to identify possible weight problems. It estimates body fat based on height and weight. Your health care provider can help determine your BMI and help you achieve or maintain a healthy weight. Get regular exercise Get regular exercise. This is one of the most important things you can do for your health. Most adults should: Exercise for at least 150 minutes each week. The exercise should increase your heart rate and make you sweat (moderate-intensity exercise). Do strengthening exercises at least twice a week. This is in addition to the moderate-intensity exercise. Spend less time sitting. Even light physical activity can be beneficial. Watch cholesterol and blood lipids Have your blood tested for lipids and cholesterol at 59 years of age, then have this test every 5 years. You may need to have your cholesterol levels checked more often if: Your lipid or cholesterol levels are high. You are older than 59 years of age. You are at high risk for heart disease. What should I know about cancer screening? Many types of cancers can be detected early and may often be prevented. Depending on your health history and family history, you may need to  have cancer screening at various ages. This may include screening for: Colorectal cancer. Prostate cancer. Skin cancer. Lung cancer. What should I know about heart disease, diabetes, and high blood pressure? Blood pressure and heart disease High blood pressure causes heart disease and increases the risk of stroke. This is more likely to develop in people who have high blood pressure readings or are overweight. Talk with your health care provider about your target blood pressure readings. Have your blood pressure checked: Every 3-5 years if you are 40-68 years of age. Every year if you are 61 years old or older. If you are between the ages of 51 and 53 and are a current or former smoker, ask your health care provider if you should have a one-time screening for abdominal aortic aneurysm (AAA). Diabetes Have regular diabetes screenings. This checks your fasting blood sugar level. Have the screening done: Once every three years after age 59 if you are at a normal weight and have a low risk for diabetes. More often and at a younger age if you are overweight or have a high risk for diabetes. What should I know about preventing infection? Hepatitis B If you have a higher risk for hepatitis B, you should be screened for this virus. Talk with your health care provider to find out if you are at risk for hepatitis B infection. Hepatitis C Blood testing is recommended for: Everyone  born from [birth year] through 1967. Anyone with known risk factors for hepatitis C. Sexually transmitted infections (STIs) You should be screened each year for STIs, including gonorrhea and chlamydia, if: You are sexually active and are younger than 59 years of age. You are older than 59 years of age and your health care provider tells you that you are at risk for this type of infection. Your sexual activity has changed since you were last screened, and you are at increased risk for chlamydia or gonorrhea. Ask your health care  provider if you are at risk. Ask your health care provider about whether you are at high risk for HIV. Your health care provider may recommend a prescription medicine to help prevent HIV infection. If you choose to take medicine to prevent HIV, you should first get tested for HIV. You should then be tested every 3 months for as long as you are taking the medicine. Follow these instructions at home: Alcohol use Do not drink alcohol if your health care provider tells you not to drink. If you drink alcohol: Limit how much you have to 0-2 drinks a day. Know how much alcohol is in your drink. In the [place] one drink equals one 12 oz bottle of beer (355 mL), one 5 oz glass of wine (148 mL), or one 1 oz glass of hard liquor (44 mL). Lifestyle Do not use any products that contain nicotine or tobacco. These products include cigarettes, chewing tobacco, and vaping devices, such as e-cigarettes. If you need help quitting, ask your health care provider. Do not use street drugs. Do not share needles. Ask your health care provider for help if you need support or information about quitting drugs. General instructions Schedule regular health, dental, and eye exams. Stay current with your vaccines. Tell your health care provider if: You often feel depressed. You have ever been abused or do not feel safe at home. Summary Adopting a healthy lifestyle and getting preventive care are important in promoting health and wellness. Follow your health care provider's instructions about healthy diet, exercising, and getting tested or screened for diseases. Follow your health care provider's instructions on monitoring your cholesterol and blood pressure. This information is not intended to replace advice given to you by your health care provider. Make sure you discuss any questions you have with your health care provider. Document Revised: 08/05/2020 Document Reviewed: 08/05/2020 Elsevier Patient Education  2024  [employer].

## 2023-01-27 NOTE — Telephone Encounter (Signed)
Patient wanted to know if you could update him with the results of his x-ray. He is still very concerned about his head discomfort. Thank you.

## 2023-01-27 NOTE — Progress Notes (Signed)
MEDICARE ANNUAL WELLNESS VISIT  01/27/2023  Telephone Visit Disclaimer This Medicare AWV was conducted by telephone due to national recommendations for restrictions regarding the COVID-19 Pandemic (e.g. social distancing).  I verified, using two identifiers, that I am speaking with Louis Bennett or their authorized healthcare agent. I discussed the limitations, risks, security, and privacy concerns of performing an evaluation and management service by telephone and the potential availability of an in-person appointment in the future. The patient expressed understanding and agreed to proceed.  Location of Patient: Home Location of Provider (nurse):  In the office.  Subjective:    Louis Bennett is a 57 y.o. male patient of Christen Butter, NP who had a Medicare Annual Wellness Visit today via telephone. Louis Bennett is Disabled and lives with their partner. he has 5 children. he reports that he is socially active and does interact with friends/family regularly. he is moderately physically active and enjoys working out.  Patient Care Team: Christen Butter, NP as PCP - General (Nurse Practitioner)     01/27/2023   10:02 AM 01/23/2022    9:30 AM 01/10/2021    9:06 AM  Advanced Directives  Does Patient Have a Medical Advance Directive? No No No  Would patient like information on creating a medical advance directive? No - Patient declined No - Patient declined No - Patient declined    Hospital Utilization Over the Past 12 Months: # of hospitalizations or ER visits: 1 # of surgeries: 0  Review of Systems    Patient reports that his overall health is  a little worse due to his recent headaches  compared to last year.  History obtained from chart review and the patient  Patient Reported Readings (BP, Pulse, CBG, Weight, etc) none Per patient no change in vitals since last visit, unable to obtain new vitals due to telehealth visit  Pain Assessment Pain : 0-10 Pain Score: 0-No  pain     Current Medications & Allergies (verified) Allergies as of 01/27/2023   No Known Allergies      Medication List        Accurate as of January 27, 2023 10:15 AM. If you have any questions, ask your nurse or doctor.          STOP taking these medications    meloxicam 15 MG tablet Commonly known as: MOBIC   predniSONE 20 MG tablet Commonly known as: DELTASONE       TAKE these medications    abacavir-dolutegravir-lamiVUDine 600-50-300 MG tablet Commonly known as: TRIUMEQ Take 1 tablet by mouth every morning.   atorvastatin 20 MG tablet Commonly known as: LIPITOR Take 1 tablet (20 mg total) by mouth daily.   metFORMIN 500 MG 24 hr tablet Commonly known as: GLUCOPHAGE-XR Take 1 tablet (500 mg total) by mouth daily with breakfast.   sildenafil 20 MG tablet Commonly known as: REVATIO Take 1-5 tablets (20-100 mg total) by mouth as needed.        History (reviewed): Past Medical History:  Diagnosis Date   Acute hypoxemic respiratory failure (HCC) 03/29/2020   Anxiety    Arthritis    CKD (chronic kidney disease) stage 3, GFR 30-59 ml/min (HCC)    GERD (gastroesophageal reflux disease)    HIV positive (HCC)    Pneumonia due to COVID-19 virus 03/27/2020   Right lumbar radiculitis    History reviewed. No pertinent surgical history. Family History  Problem Relation Age of Onset   Hypertension Mother    Diabetes Mother  Arthritis Mother    Obesity Mother    Heart failure Brother    Hypertension Brother    Stroke Brother    Early death Brother    Hypertension Brother    Stroke Brother    Stroke Brother    Social History   Socioeconomic History   Marital status: Significant Other    Spouse name: Asencion Partridge   Number of children: 5   Years of education: 12   Highest education level: 12th grade  Occupational History   Occupation: Legally Disabled  Tobacco Use   Smoking status: Former    Current packs/day: 0.00    Average packs/day:  0.3 packs/day for 15.0 years (3.8 ttl pk-yrs)    Types: Cigarettes    Start date: 01/27/2005    Quit date: 01/28/2020    Years since quitting: 3.0   Smokeless tobacco: Never   Tobacco comments:    I quit 2 yrs ago  Vaping Use   Vaping status: Former   Substances: Nicotine  Substance and Sexual Activity   Alcohol use: Yes    Alcohol/week: 2.0 standard drinks of alcohol    Types: 2 Cans of beer per week    Comment: Rarely   Drug use: Not Currently    Types: Marijuana   Sexual activity: Yes    Partners: Female    Birth control/protection: Condom, None  Other Topics Concern   Not on file  Social History Narrative   Lives with significant other, Lisa. He has five children. He enjoys working out.    Social Determinants of Health   Financial Resource Strain: Low Risk  (01/24/2023)   Overall Financial Resource Strain (CARDIA)    Difficulty of Paying Living Expenses: Not hard at all  Food Insecurity: No Food Insecurity (01/24/2023)   Hunger Vital Sign    Worried About Running Out of Food in the Last Year: Never true    Ran Out of Food in the Last Year: Never true  Transportation Needs: No Transportation Needs (01/24/2023)   PRAPARE - Administrator, Civil Service (Medical): No    Lack of Transportation (Non-Medical): No  Physical Activity: Insufficiently Active (01/24/2023)   Exercise Vital Sign    Days of Exercise per Week: 4 days    Minutes of Exercise per Session: 20 min  Stress: Stress Concern Present (01/24/2023)   [employer] of Occupational Health - Occupational Stress Questionnaire    Feeling of Stress : To some extent  Social Connections: Moderately Integrated (01/27/2023)   Social Connection and Isolation Panel [NHANES]    Frequency of Communication with Friends and Family: More than three times a week    Frequency of Social Gatherings with Friends and Family: Twice a week    Attends Religious Services: More than 4 times per year    Active Member  of [employer] or Organizations: No    Attends Banker Meetings: Never    Marital Status: Living with partner    Activities of Daily Living    01/24/2023    8:18 PM  In your present state of health, do you have any difficulty performing the following activities:  Hearing? 0  Vision? 0  Difficulty concentrating or making decisions? 0  Walking or climbing stairs? 0  Dressing or bathing? 0  Doing errands, shopping? 0  Preparing Food and eating ? N  Using the Toilet? N  In the past six months, have you accidently leaked urine? N  Do you have problems with  loss of bowel control? N  Managing your Medications? N  Managing your Finances? N  Housekeeping or managing your Housekeeping? N    Patient Education/ Literacy How often do you need to have someone help you when you read instructions, pamphlets, or other written materials from your doctor or pharmacy?: 1 - Never What is the last grade level you completed in school?: 12th grade  Exercise    Diet Patient reports consuming 3 meals a day and 2 snack(s) a day Patient reports that his primary diet is: Regular Patient reports that she does have regular access to food.   Depression Screen    01/27/2023   10:02 AM 08/03/2022    8:53 AM 01/23/2022    9:28 AM 12/22/2021   11:42 AM 01/10/2021    9:07 AM 09/28/2019   10:21 AM  PHQ 2/9 Scores  PHQ - 2 Score 0 0 0 0 0 0  PHQ- 9 Score      0     Fall Risk    01/27/2023    9:59 AM 01/24/2023    8:18 PM 08/03/2022    8:53 AM 01/23/2022    9:28 AM 01/19/2022    1:07 PM  Fall Risk   Falls in the past year? 0 0 0 0 0  Number falls in past yr: 0 0 0 0 0  Injury with Fall? 0 0 0 0 0  Risk for fall due to : No Fall Risks  No Fall Risks No Fall Risks   Follow up Falls evaluation completed  Falls evaluation completed Falls evaluation completed      Objective:  Louis Bennett seemed alert and oriented and he participated appropriately during our telephone visit.  Blood  Pressure Weight BMI  BP Readings from Last 3 Encounters:  01/11/23 128/77  08/03/22 128/75  02/02/22 122/74   Wt Readings from Last 3 Encounters:  01/11/23 182 lb 12.8 oz (82.9 kg)  08/03/22 185 lb 0.6 oz (83.9 kg)  02/02/22 177 lb (80.3 kg)   BMI Readings from Last 1 Encounters:  01/11/23 28.63 kg/m    *Unable to obtain current vital signs, weight, and BMI due to telephone visit type  Hearing/Vision  Yousuf did not seem to have difficulty with hearing/understanding during the telephone conversation Reports that he has not had a formal eye exam by an eye care professional within the past year Reports that he has not had a formal hearing evaluation within the past year *Unable to fully assess hearing and vision during telephone visit type  Cognitive Function:    01/27/2023   10:08 AM 01/23/2022    9:30 AM 01/10/2021    9:14 AM  6CIT Screen  What Year? 0 points 0 points 0 points  What month? 0 points 0 points 0 points  What time? 0 points 0 points 0 points  Count back from 20 0 points 0 points 0 points  Months in reverse 0 points 0 points 0 points  Repeat phrase 0 points 0 points 0 points  Total Score 0 points 0 points 0 points   (Normal:0-7, Significant for Dysfunction: >8)  Normal Cognitive Function Screening: Yes   Immunization & Health Maintenance Record Immunization History  Administered Date(s) Administered   DT (Pediatric) 01/28/1971   DTP 09/10/1964, 10/15/1964, 11/16/1964   Hep A / Hep B 04/15/2012, 08/12/2012, 11/25/2012   Influenza Split 04/11/2013, 03/14/2018, 01/17/2019, 01/11/2020   Influenza,inj,Quad PF,6+ Mos 03/14/2018, 01/17/2019, 01/11/2020, 01/15/2021, 12/22/2021   Influenza-Unspecified 04/11/2013, 03/14/2018, 01/17/2019, 01/11/2020  Measles 12/17/1968   Measles / Rubella 06/28/1970   OPV 09/10/1964, 11/16/1964, 01/28/1971   PFIZER Comirnaty(Gray Top)Covid-19 Tri-Sucrose Vaccine 05/06/2020, 05/27/2020   PNEUMOCOCCAL CONJUGATE-20 11/26/2021    PPD Test 07/13/2011, 08/12/2012   Pneumococcal Conjugate-13 07/13/2011   Pneumococcal Polysaccharide-23 04/15/2012, 03/14/2018   Rubella 12/24/1968   Tdap 04/29/2011    Health Maintenance  Topic Date Due   DTaP/Tdap/Td (6 - Td or Tdap) 04/28/2021   COVID-19 Vaccine (3 - Pfizer risk series) 01/27/2023 (Originally 06/24/2020)   Zoster Vaccines- Shingrix (1 of 2) 04/29/2023 (Originally 12/29/1982)   INFLUENZA VACCINE  06/28/2023 (Originally 10/29/2022)   Colonoscopy  01/27/2024 (Originally 12/28/2008)   Medicare Annual Wellness (AWV)  01/27/2024   Hepatitis C Screening  Completed   HIV Screening  Completed   HPV VACCINES  Aged Out       Assessment  This is a routine wellness examination for Louis Bennett.  Health Maintenance: Due or Overdue Health Maintenance Due  Topic Date Due   DTaP/Tdap/Td (6 - Td or Tdap) 04/28/2021    Louis Bennett does not need a referral for Community Assistance: Care Management:   no Social Work:    no Prescription Assistance:  no Nutrition/Diabetes Education:  no   Plan:  Personalized Goals  Goals Addressed               This Visit's Progress     Patient Stated (pt-stated)        01/27/2023 AWV Goal: Exercise for General Health  Patient will verbalize understanding of the benefits of increased physical activity: Exercising regularly is important. It will improve your overall fitness, flexibility, and endurance. Regular exercise also will improve your overall health. It can help you control your weight, reduce stress, and improve your bone density. Over the next year, patient will increase physical activity as tolerated with a goal of at least 150 minutes of moderate physical activity per week.  You can tell that you are exercising at a moderate intensity if your heart starts beating faster and you start breathing faster but can still hold a conversation. Moderate-intensity exercise ideas include: Walking 1 mile (1.6 km) in about  15 minutes Biking Hiking Golfing Dancing Water aerobics Patient will verbalize understanding of everyday activities that increase physical activity by providing examples like the following: Yard work, such as: Insurance underwriter Gardening Washing windows or floors Patient will be able to explain general safety guidelines for exercising:  Before you start a new exercise program, talk with your health care provider. Do not exercise so much that you hurt yourself, feel dizzy, or get very short of breath. Wear comfortable clothes and wear shoes with good support. Drink plenty of water while you exercise to prevent dehydration or heat stroke. Work out until your breathing and your heartbeat get faster.        Personalized Health Maintenance & Screening Recommendations  Td vaccine Shingles vaccine Influenza vaccine Colonoscopy  Lung Cancer Screening Recommended: no (Low Dose CT Chest recommended if Age 73-80 years, 20 pack-year currently smoking OR have quit w/in past 15 years) Hepatitis C Screening recommended: no HIV Screening recommended: no  Advanced Directives: Written information was not prepared per patient's request.  Referrals & Orders No orders of the defined types were placed in this encounter.   Follow-up Plan Follow-up with Christen Butter, NP as planned Schedule td vaccine and shingles vaccine at the pharmacy. Influenza vaccine can be done  at the pharmacy or in office.  Please update the office regarding colorectal cancer screening. Medicare wellness visit in one year.  Patient will access AVS on my chart.   I have personally reviewed and noted the following in the patient's chart:   Medical and social history Use of alcohol, tobacco or illicit drugs  Current medications and supplements Functional ability and status Nutritional status Physical activity Advanced directives List  of other physicians Hospitalizations, surgeries, and ER visits in previous 12 months Vitals Screenings to include cognitive, depression, and falls Referrals and appointments  In addition, I have reviewed and discussed with Louis Bennett certain preventive protocols, quality metrics, and best practice recommendations. A written personalized care plan for preventive services as well as general preventive health recommendations is available and can be mailed to the patient at his request.      Modesto Charon, RN BSN  01/27/2023

## 2023-01-28 NOTE — Telephone Encounter (Signed)
I called [facility] and changed the order to stat.

## 2023-02-05 ENCOUNTER — Encounter: Payer: 59 | Admitting: Medical-Surgical

## 2023-02-07 ENCOUNTER — Other Ambulatory Visit: Payer: Self-pay | Admitting: Medical-Surgical

## 2023-02-15 ENCOUNTER — Ambulatory Visit (INDEPENDENT_AMBULATORY_CARE_PROVIDER_SITE_OTHER): Payer: 59 | Admitting: Medical-Surgical

## 2023-02-15 ENCOUNTER — Encounter: Payer: Self-pay | Admitting: Medical-Surgical

## 2023-02-15 VITALS — BP 117/74 | HR 83 | Resp 20 | Ht 67.0 in | Wt 179.3 lb

## 2023-02-15 DIAGNOSIS — E785 Hyperlipidemia, unspecified: Secondary | ICD-10-CM

## 2023-02-15 DIAGNOSIS — N1831 Chronic kidney disease, stage 3a: Secondary | ICD-10-CM | POA: Diagnosis not present

## 2023-02-15 DIAGNOSIS — Z0000 Encounter for general adult medical examination without abnormal findings: Secondary | ICD-10-CM | POA: Diagnosis not present

## 2023-02-15 DIAGNOSIS — R7303 Prediabetes: Secondary | ICD-10-CM | POA: Diagnosis not present

## 2023-02-15 NOTE — Progress Notes (Signed)
Complete physical exam  Patient: Louis Bennett   DOB: 02/22/1966   57 y.o. Male  MRN: [id number]  Subjective:    Chief Complaint  Patient presents with   Annual Exam   Yuto Dirienzo is a 57 y.o. male who presents today for a complete physical exam. He reports consuming a general diet.  Doing weight lifting for exercise.  He generally feels well. He reports sleeping well. He does not have additional problems to discuss today.    Most recent fall risk assessment:    01/27/2023    9:59 AM  Fall Risk   Falls in the past year? 0  Number falls in past yr: 0  Injury with Fall? 0  Risk for fall due to : No Fall Risks  Follow up Falls evaluation completed     Most recent depression screenings:    01/27/2023   10:02 AM 08/03/2022    8:53 AM  PHQ 2/9 Scores  PHQ - 2 Score 0 0    Vision:Not within last year , Dental: No current dental problems and Receives regular dental care, and STD: The patient reports a past history of: HIV    Patient Care Team: Christen Butter, NP as PCP - General (Nurse Practitioner)   Outpatient Medications Prior to Visit  Medication Sig   abacavir-dolutegravir-lamiVUDine (TRIUMEQ) 600-50-300 MG tablet Take 1 tablet by mouth every morning.   atorvastatin (LIPITOR) 20 MG tablet Take 1 tablet (20 mg total) by mouth daily.   metFORMIN (GLUCOPHAGE-XR) 500 MG 24 hr tablet Take 1 tablet (500 mg total) by mouth daily with breakfast.   sildenafil (REVATIO) 20 MG tablet Take 1-5 tablets (20-100 mg total) by mouth as needed.   No facility-administered medications prior to visit.    Review of Systems  Constitutional:  Negative for chills, fever, malaise/fatigue and weight loss.  HENT:  Negative for congestion, ear pain, hearing loss, sinus pain and sore throat.   Eyes:  Negative for blurred vision, photophobia and pain.  Respiratory:  Negative for cough, shortness of breath and wheezing.   Cardiovascular:  Negative for chest pain, palpitations and leg  swelling.  Gastrointestinal:  Negative for abdominal pain, constipation, diarrhea, heartburn, nausea and vomiting.  Genitourinary:  Negative for dysuria, frequency and urgency.  Musculoskeletal:  Negative for falls and neck pain.  Skin:  Negative for itching and rash.  Neurological:  Negative for dizziness, weakness and headaches.  Endo/Heme/Allergies:  Negative for polydipsia. Does not bruise/bleed easily.  Psychiatric/Behavioral:  Negative for depression, substance abuse and suicidal ideas. The patient is not nervous/anxious.      Objective:    BP 117/74 (BP Location: Left Arm, Cuff Size: Normal)   Pulse 83   Resp 20   Ht 5\' 7"  (1.702 m)   Wt 179 lb 4.8 oz (81.3 kg)   SpO2 97%   BMI 28.08 kg/m    Physical Exam Constitutional:      General: He is not in acute distress.    Appearance: Normal appearance. He is not ill-appearing.  HENT:     Head: Normocephalic and atraumatic.     Right Ear: Tympanic membrane, ear canal and external ear normal. There is no impacted cerumen.     Left Ear: Tympanic membrane, ear canal and external ear normal. There is no impacted cerumen.     Nose: Nose normal. No congestion or rhinorrhea.     Mouth/Throat:     Mouth: Mucous membranes are moist.     Pharynx: No oropharyngeal exudate  or posterior oropharyngeal erythema.  Eyes:     General: No scleral icterus.       Right eye: No discharge.        Left eye: No discharge.     Extraocular Movements: Extraocular movements intact.     Conjunctiva/sclera: Conjunctivae normal.     Pupils: Pupils are equal, round, and reactive to light.  Neck:     Thyroid: No thyromegaly.     Vascular: No carotid bruit or JVD.     Trachea: Trachea normal.  Cardiovascular:     Rate and Rhythm: Normal rate and regular rhythm.     Pulses: Normal pulses.     Heart sounds: Normal heart sounds. No murmur heard.    No friction rub. No gallop.  Pulmonary:     Effort: Pulmonary effort is normal. No respiratory distress.      Breath sounds: Normal breath sounds. No wheezing.  Abdominal:     General: Bowel sounds are normal. There is no distension.     Palpations: Abdomen is soft.     Tenderness: There is abdominal tenderness (Mild diffuse). There is no guarding.  Musculoskeletal:        General: Normal range of motion.     Cervical back: Normal range of motion and neck supple.  Lymphadenopathy:     Cervical: No cervical adenopathy.  Skin:    General: Skin is warm and dry.  Neurological:     Mental Status: He is alert and oriented to person, place, and time.     Cranial Nerves: No cranial nerve deficit.  Psychiatric:        Mood and Affect: Mood normal.        Behavior: Behavior normal.        Thought Content: Thought content normal.        Judgment: Judgment normal.      No results found for any visits on 02/15/23.     Assessment & Plan:    Routine Health Maintenance and Physical Exam  Immunization History  Administered Date(s) Administered   DT (Pediatric) 01/28/1971   DTP 09/10/1964, 10/15/1964, 11/16/1964   Hep A / Hep B 04/15/2012, 08/12/2012, 11/25/2012   Influenza Split 04/11/2013, 03/14/2018, 01/17/2019, 01/11/2020   Influenza,inj,Quad PF,6+ Mos 03/14/2018, 01/17/2019, 01/11/2020, 01/15/2021, 12/22/2021   Influenza-Unspecified 04/11/2013, 03/14/2018, 01/17/2019, 01/11/2020   Measles 12/17/1968   Measles / Rubella 06/28/1970   OPV 09/10/1964, 11/16/1964, 01/28/1971   PFIZER Comirnaty(Gray Top)Covid-19 Tri-Sucrose Vaccine 05/06/2020, 05/27/2020   PNEUMOCOCCAL CONJUGATE-20 11/26/2021   PPD Test 07/13/2011, 08/12/2012   Pneumococcal Conjugate-13 07/13/2011   Pneumococcal Polysaccharide-23 04/15/2012, 03/14/2018   Rubella 12/24/1968   Tdap 04/29/2011    Health Maintenance  Topic Date Due   COVID-19 Vaccine (3 - Pfizer risk series) 03/03/2023 (Originally 06/24/2020)   Zoster Vaccines- Shingrix (1 of 2) 04/29/2023 (Originally 12/29/1982)   INFLUENZA VACCINE  06/28/2023 (Originally  10/29/2022)   Colonoscopy  01/27/2024 (Originally 12/28/2008)   DTaP/Tdap/Td (6 - Td or Tdap) 02/15/2024 (Originally 04/28/2021)   Medicare Annual Wellness (AWV)  01/27/2024   Hepatitis C Screening  Completed   HIV Screening  Completed   HPV VACCINES  Aged Out    Discussed health benefits of physical activity, and encouraged him to engage in regular exercise appropriate for his age and condition.  1. Annual physical exam Reviewed recent lab results.  Up-to-date on preventative care.  Recommend updating eye exam.  Wellness information provided with AVS.  2. Prediabetes Taking metformin but this may be causing worsening  of heartburn.  Okay to stop metformin x 1 week to evaluate effect on heartburn.  If this is a problem, we may consider switching him over to berberine to see if this is better tolerated.  Not due for A1c check at this time.  3. Hyperlipidemia, unspecified hyperlipidemia type Taking Lipitor 20 mg daily but this may also be contributing to heartburn.  Would recommend giving this a week off the medication after trying the week off of metformin to see if this may be the culprit.  For now, plan to continue the medication daily and recheck labs in 2-4 weeks. - Lipid panel - Comprehensive metabolic panel  4. Stage 3a chronic kidney disease (HCC) Continues to have reduced kidney function according to most recent labs on 11/6.  Continue to avoid anti-inflammatories and work to stay very well-hydrated to prevent worsening of kidney disease.   Return in about 6 months (around 08/15/2023) for chronic disease follow up.     Christen Butter, NP

## 2023-02-15 NOTE — Patient Instructions (Signed)

## 2023-03-09 NOTE — Telephone Encounter (Signed)
Patient seen in office 02/15/23 with Christen Butter, NP

## 2023-03-27 DIAGNOSIS — R059 Cough, unspecified: Secondary | ICD-10-CM | POA: Diagnosis not present

## 2023-07-19 ENCOUNTER — Other Ambulatory Visit: Payer: Self-pay | Admitting: Medical-Surgical

## 2023-07-19 DIAGNOSIS — R7303 Prediabetes: Secondary | ICD-10-CM

## 2023-07-29 DIAGNOSIS — Z79899 Other long term (current) drug therapy: Secondary | ICD-10-CM | POA: Diagnosis not present

## 2023-07-29 DIAGNOSIS — R9431 Abnormal electrocardiogram [ECG] [EKG]: Secondary | ICD-10-CM | POA: Diagnosis not present

## 2023-07-29 DIAGNOSIS — R531 Weakness: Secondary | ICD-10-CM | POA: Diagnosis not present

## 2023-07-29 DIAGNOSIS — Z87891 Personal history of nicotine dependence: Secondary | ICD-10-CM | POA: Diagnosis not present

## 2023-07-29 DIAGNOSIS — R072 Precordial pain: Secondary | ICD-10-CM | POA: Diagnosis not present

## 2023-07-29 DIAGNOSIS — R079 Chest pain, unspecified: Secondary | ICD-10-CM | POA: Diagnosis not present

## 2023-07-29 DIAGNOSIS — R0602 Shortness of breath: Secondary | ICD-10-CM | POA: Diagnosis not present

## 2023-07-29 DIAGNOSIS — R7989 Other specified abnormal findings of blood chemistry: Secondary | ICD-10-CM | POA: Diagnosis not present

## 2023-07-29 DIAGNOSIS — R0789 Other chest pain: Secondary | ICD-10-CM | POA: Diagnosis not present

## 2023-07-29 DIAGNOSIS — R110 Nausea: Secondary | ICD-10-CM | POA: Diagnosis not present

## 2023-08-01 ENCOUNTER — Encounter: Payer: Self-pay | Admitting: Medical-Surgical

## 2023-08-02 DIAGNOSIS — R0609 Other forms of dyspnea: Secondary | ICD-10-CM | POA: Diagnosis not present

## 2023-08-02 DIAGNOSIS — R5383 Other fatigue: Secondary | ICD-10-CM | POA: Diagnosis not present

## 2023-08-02 DIAGNOSIS — I10 Essential (primary) hypertension: Secondary | ICD-10-CM | POA: Diagnosis not present

## 2023-08-02 NOTE — Telephone Encounter (Signed)
 Patient scheduled for tomorrow 07/04/23 @ 3pm with Cherre Cornish, NP

## 2023-08-03 ENCOUNTER — Ambulatory Visit (INDEPENDENT_AMBULATORY_CARE_PROVIDER_SITE_OTHER): Admitting: Medical-Surgical

## 2023-08-03 VITALS — BP 108/74 | HR 107 | Resp 20 | Ht 67.0 in | Wt 173.0 lb

## 2023-08-03 DIAGNOSIS — D7589 Other specified diseases of blood and blood-forming organs: Secondary | ICD-10-CM | POA: Diagnosis not present

## 2023-08-03 DIAGNOSIS — E785 Hyperlipidemia, unspecified: Secondary | ICD-10-CM | POA: Diagnosis not present

## 2023-08-03 DIAGNOSIS — R5383 Other fatigue: Secondary | ICD-10-CM | POA: Diagnosis not present

## 2023-08-03 DIAGNOSIS — R7303 Prediabetes: Secondary | ICD-10-CM | POA: Diagnosis not present

## 2023-08-03 DIAGNOSIS — R002 Palpitations: Secondary | ICD-10-CM

## 2023-08-03 DIAGNOSIS — R0609 Other forms of dyspnea: Secondary | ICD-10-CM

## 2023-08-03 DIAGNOSIS — R030 Elevated blood-pressure reading, without diagnosis of hypertension: Secondary | ICD-10-CM

## 2023-08-03 LAB — POCT GLYCOSYLATED HEMOGLOBIN (HGB A1C)
HbA1c, POC (prediabetic range): 5.9 % (ref 5.7–6.4)
Hemoglobin A1C: 5.9 % — AB (ref 4.0–5.6)

## 2023-08-03 NOTE — Progress Notes (Unsigned)
        Established patient visit  History, exam, impression, and plan:  1. Elevated blood pressure reading (Primary) Very pleasant 58 year old male presenting today for concerns regarding elevated blood pressure.  Since he has been here last, he was seen in the ED for chest pain and palpitations.  He was connected with a cardiologist who placed him on candesartan 8 mg daily.  He has been taking the medication as prescribed, no missed doses.  Tolerates the medication well without side effects.  Notes that over the last couple of weeks his blood pressure has still run high and he did not feel that the candesartan was working.  Today, his blood pressure is beautiful at 108/74.  Following a low-sodium diet and exercising regularly.  Does endorse some anxiety and stress in his life which could certainly affect his blood pressure negatively. Denies CP, SOB, palpitations, lower extremity edema, dizziness, headaches, or vision changes today.  Cardiopulmonary exam is normal.  Given that his blood pressure is stable today, no changes in medications.  2. Prediabetes History of prediabetes with his last hemoglobin A1c 6.4%.  He was started on metformin  500 mg once daily which he has been taking as prescribed.  Tolerates the medication well.  Recheck of his hemoglobin A1c today 5.9%.  Worries that the medication may be related to some of the extreme fatigue he is having.  Advised that he is okay to do a trial discontinuation of the metformin  for the next 2 weeks to see if this improves his fatigue/weakness symptoms.  If not, restart metformin  500 mg once daily. - POCT HgB A1C  3. Hyperlipidemia, unspecified hyperlipidemia type Taking atorvastatin  20 mg daily, tolerating well, no side effects.  Has been on this long-term so I do not think this is related to his weakness and fatigue.  Up-to-date on lipid checks.  Continue atorvastatin  as prescribed.  4. Fatigue, unspecified type Endorses debilitating weakness and  activity intolerance over the past several months.  Previously exercising regularly and continues to make efforts for this but has difficulty completing his routines without frequent rest breaks.  He has been checked for cardiac etiology with a normal stress test and no significant concerns on his echocardiogram.  Plan to evaluate labs as below for further investigation. - TSH - Testosterone , Free, Total, SHBG - Iron, TIBC and Ferritin Panel  5. Dyspnea on exertion He does have some dyspnea on exertion and activity intolerance.  He has a history of mild anemia.  Checking iron panel today. - Iron, TIBC and Ferritin Panel  6. Palpitations Checking TSH. - TSH  7. Macrocytosis On his most recent CBC, he did have mild macrocytosis.  Checking vitamin B12 and folate. - Vitamin B12 - Folate  Procedures performed this visit: None.  Return if symptoms worsen or fail to improve.  __________________________________ Maryl Snook, DNP, APRN, FNP-BC Primary Care and Sports Medicine [facility] [place]

## 2023-08-04 ENCOUNTER — Encounter: Payer: Self-pay | Admitting: Medical-Surgical

## 2023-08-05 ENCOUNTER — Encounter: Payer: Self-pay | Admitting: Medical-Surgical

## 2023-08-05 DIAGNOSIS — R7989 Other specified abnormal findings of blood chemistry: Secondary | ICD-10-CM

## 2023-08-05 LAB — IRON,TIBC AND FERRITIN PANEL
Ferritin: 288 ng/mL (ref 30–400)
Iron Saturation: 19 % (ref 15–55)
Iron: 59 ug/dL (ref 38–169)
Total Iron Binding Capacity: 304 ug/dL (ref 250–450)
UIBC: 245 ug/dL (ref 111–343)

## 2023-08-05 LAB — TESTOSTERONE, FREE, TOTAL, SHBG
Sex Hormone Binding: 34.2 nmol/L (ref 19.3–76.4)
Testosterone, Free: 5.1 pg/mL — ABNORMAL LOW (ref 7.2–24.0)
Testosterone: 260 ng/dL — ABNORMAL LOW (ref 264–916)

## 2023-08-05 LAB — VITAMIN B12: Vitamin B-12: 651 pg/mL (ref 232–1245)

## 2023-08-05 LAB — FOLATE: Folate: 9.4 ng/mL (ref >3.0)

## 2023-08-05 LAB — TSH: TSH: 0.95 u[IU]/mL (ref 0.450–4.500)

## 2023-08-12 DIAGNOSIS — R5383 Other fatigue: Secondary | ICD-10-CM | POA: Diagnosis not present

## 2023-08-12 DIAGNOSIS — R0609 Other forms of dyspnea: Secondary | ICD-10-CM | POA: Diagnosis not present

## 2023-08-16 ENCOUNTER — Ambulatory Visit (INDEPENDENT_AMBULATORY_CARE_PROVIDER_SITE_OTHER): Payer: 59 | Admitting: Medical-Surgical

## 2023-08-16 ENCOUNTER — Encounter: Payer: Self-pay | Admitting: Medical-Surgical

## 2023-08-16 VITALS — BP 128/69 | HR 80 | Resp 20 | Ht 67.0 in | Wt 175.0 lb

## 2023-08-16 DIAGNOSIS — N1831 Chronic kidney disease, stage 3a: Secondary | ICD-10-CM

## 2023-08-16 DIAGNOSIS — E785 Hyperlipidemia, unspecified: Secondary | ICD-10-CM | POA: Diagnosis not present

## 2023-08-16 DIAGNOSIS — M542 Cervicalgia: Secondary | ICD-10-CM

## 2023-08-16 DIAGNOSIS — Z21 Asymptomatic human immunodeficiency virus [HIV] infection status: Secondary | ICD-10-CM | POA: Diagnosis not present

## 2023-08-16 DIAGNOSIS — R7989 Other specified abnormal findings of blood chemistry: Secondary | ICD-10-CM | POA: Diagnosis not present

## 2023-08-16 LAB — POCT URINALYSIS DIP (CLINITEK)
Bilirubin, UA: NEGATIVE
Blood, UA: NEGATIVE
Glucose, UA: NEGATIVE mg/dL
Ketones, POC UA: NEGATIVE mg/dL
Leukocytes, UA: NEGATIVE
Nitrite, UA: NEGATIVE
POC PROTEIN,UA: NEGATIVE
Spec Grav, UA: 1.025 (ref 1.010–1.025)
Urobilinogen, UA: 0.2 U/dL
pH, UA: 5.5 (ref 5.0–8.0)

## 2023-08-16 LAB — POCT UA - MICROALBUMIN
Creatinine, POC: 300 mg/dL
Microalbumin Ur, POC: 80 mg/L

## 2023-08-16 MED ORDER — CYCLOBENZAPRINE HCL 10 MG PO TABS: 5.0000 mg | ORAL_TABLET | Freq: Three times a day (TID) | ORAL | 0 refills | Status: DC | PRN

## 2023-08-16 MED ORDER — CELECOXIB 200 MG PO CAPS: 200.0000 mg | ORAL_CAPSULE | Freq: Two times a day (BID) | ORAL | 1 refills | Status: DC

## 2023-08-16 NOTE — Progress Notes (Signed)
        Established patient visit  History, exam, impression, and plan:  1. HIV infection, unspecified symptom status (HCC) (Primary) Pleasant 58 year old male presenting today with a history of HIV infection followed by infectious disease.  He is compliant with Triumeq dosing and is up-to-date on follow-ups and lab work.  Denies any concerns today.  2. Stage 3a chronic kidney disease (HCC) Notable for stage III chronic kidney disease.  Reports that his urine has been very frothy lately and that he has become quite concerned. His last renal function showed mildly elevated creatinine which is his baseline, normal BUN, and GFR of 54. POCT UA normal, Microalbumin abnormal.  - POCT UA - Microalbumin  3. Hyperlipidemia, unspecified hyperlipidemia type History of HLD currently on Lipitor 20mg  daily. Has been working with cardiology to evaluate palpitations, fatigue, and shortness of breath. On imaging, mild left atrium dilation and aortic atherosclerosis. Discussed further options to evaluate for risks related to cholesterol and cardiac issues. Ordering CT coronary calcium  scoring for further evaluation. Patient aware of self pay fee for the scan.  - CT CARDIAC SCORING (SELF PAY ONLY); Future  4. Cervicalgia Has started having left sided neck pain that radiates to the shoulder and sometimes the scapular region. Accompanied by a sharp stabbing pain the radiates up the back of his head on the left side. Has not taken anything for his symptoms but has been doing home PT exercises and heat/ice for the last 2 weeks or so. Not seeing much improvement yet. Symptoms consistent with cervical radiculopathy with potential occipital neuralgia. Plan to start Celebrex  200mg  BID for 1-2 weeks then use BID PRN. Adding Flexeril  5-10mg  TID PRN. If not improvement in 4-6 weeks, return for further evaluation.  - celecoxib  (CELEBREX ) 200 MG capsule; Take 1 capsule (200 mg total) by mouth 2 (two) times daily.  Dispense: 60  capsule; Refill: 1 - cyclobenzaprine  (FLEXERIL ) 10 MG tablet; Take 0.5-1 tablets (5-10 mg total) by mouth 3 (three) times daily as needed.  Dispense: 30 tablet; Refill: 0  Procedures performed this visit: None.  Return in about 3 months (around 11/16/2023) for chronic disease follow up.  __________________________________ Maryl Snook, DNP, APRN, FNP-BC Primary Care and Sports Medicine [facility] [place]

## 2023-08-17 LAB — FSH/LH
FSH: 3.2 m[IU]/mL (ref 1.5–12.4)
LH: 5.8 m[IU]/mL (ref 1.7–8.6)

## 2023-08-17 LAB — TESTOSTERONE, FREE, TOTAL, SHBG
Sex Hormone Binding: 27.9 nmol/L (ref 19.3–76.4)
Testosterone, Free: 6.4 pg/mL — ABNORMAL LOW (ref 7.2–24.0)
Testosterone: 438 ng/dL (ref 264–916)

## 2023-08-18 ENCOUNTER — Ambulatory Visit: Payer: Self-pay | Admitting: Medical-Surgical

## 2023-10-20 ENCOUNTER — Other Ambulatory Visit: Payer: Self-pay | Admitting: Medical-Surgical

## 2023-10-20 DIAGNOSIS — R7303 Prediabetes: Secondary | ICD-10-CM

## 2023-10-24 ENCOUNTER — Other Ambulatory Visit: Payer: Self-pay | Admitting: Medical-Surgical

## 2023-10-24 DIAGNOSIS — M542 Cervicalgia: Secondary | ICD-10-CM

## 2023-11-03 ENCOUNTER — Ambulatory Visit: Payer: Self-pay

## 2023-11-03 NOTE — Telephone Encounter (Signed)
 FYI Only or Action Required?: FYI only for provider.  Patient was last seen in primary care on 08/16/2023 by Willo Mini, NP.  Called Nurse Triage reporting Neck Pain.  Symptoms began 2 weeks ago.  Interventions attempted: OTC medications: Tylenol and Rest, hydration, or home remedies.  Symptoms are: unchanged.  Triage Disposition: See PCP When Office is Open (Within 3 Days)  Patient/caregiver understands and will follow disposition?: Yes  Copied from CRM #[id number]. Topic: Clinical - Red Word Triage >> Nov 03, 2023  4:37 PM Fredrica W wrote: Red Word that prompted transfer to Nurse Triage: Possible pinched nerve - pain/throbbing from neck down arm Reason for Disposition  Pain shoots (radiates) into arm or hand  Answer Assessment - Initial Assessment Questions 1. ONSET: When did the pain begin?      Started two weeks ago 2. LOCATION: Where does it hurt?      left side of neck into arm 3. PATTERN Does the pain come and go, or has it been constant since it started?      Comes and goes 4. SEVERITY: How bad is the pain?  (Scale 0-10; or none or slight stiffness, mild, moderate, severe)     mild 5. RADIATION: Does the pain go anywhere else, shoot into your arms?     Pain does go into arm 6. CORD SYMPTOMS: Any weakness or numbness of the arms or legs?     no 7. CAUSE: What do you think is causing the neck pain?     Possible pinched nerve 8. NECK OVERUSE: Any recent activities that involved turning or twisting the neck?     no 9. OTHER SYMPTOMS: Do you have any other symptoms? (e.g., headache, fever, chest pain, difficulty breathing, neck swelling)     no  Protocols used: Neck Pain or Stiffness-A-AH

## 2023-11-04 ENCOUNTER — Ambulatory Visit: Payer: Self-pay | Admitting: Medical-Surgical

## 2023-11-04 ENCOUNTER — Encounter: Payer: Self-pay | Admitting: Medical-Surgical

## 2023-11-04 NOTE — Progress Notes (Unsigned)
        Established patient visit   History of Present Illness   Discussed the use of AI scribe software for clinical note transcription with the patient, who gave verbal consent to proceed.  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  He has experienced left neck pain for three weeks, originating at the back of the neck and radiating to the fingers, causing numbness and tingling. The pain is sharp and stabbing, worsened by movements like bending over or shutting a drawer, but is gradually decreasing in intensity.  He takes Celebrex  200 mg for inflammation and uses ice for relief. Flexeril  was previously used but discontinued due to sedation. Neck exercises are performed to alleviate symptoms. Heat worsens his symptoms, while ice provides relief. He has not used a TENS unit.  His medications include candesartan, atorvastatin , metformin , and Triumeq. He recently resumed metformin  after a brief discontinuation.      Physical Exam   Physical Exam Vitals and nursing note reviewed.  Constitutional:      General: He is not in acute distress.    Appearance: Normal appearance. He is not ill-appearing.  HENT:     Head: Normocephalic and atraumatic.  Cardiovascular:     Rate and Rhythm: Normal rate and regular rhythm.     Pulses: Normal pulses.     Heart sounds: Normal heart sounds. No murmur heard.    No friction rub. No gallop.  Pulmonary:     Effort: Pulmonary effort is normal. No respiratory distress.     Breath sounds: Normal breath sounds.  Musculoskeletal:     Cervical back: Swelling and tenderness present. Pain with movement present. Decreased range of motion.  Skin:    General: Skin is warm and dry.  Neurological:     Mental Status: He is alert and oriented to person, place, and time.  Psychiatric:        Mood and Affect: Mood normal.         Behavior: Behavior normal.        Thought Content: Thought content normal.        Judgment: Judgment normal.     Assessment & Plan   Assessment and Plan    Cervical radiculopathy due to cervical spondylosis at C5-C6 Chronic cervical radiculopathy with recent exacerbation likely due to C5-C6 nerve compression. Symptoms include radiating pain, numbness, and tingling. - Prescribed prednisone  50 mg daily for 5 days. - Advised Flexeril  at half dose if available. - Continue home exercises and stretches. - Use heat 10-15 minutes before exercises and ice 10-15 minutes after. - Consider TENS unit for muscle relaxation. - Monitor symptoms; consider MRI if no improvement in 4-6 weeks.  Hypertension Blood pressure slightly elevated. On candesartan with past fluctuations. - Recheck blood pressure during visit, still elevated likely due to pain. - Continue candesartan as prescribed. - Monitor home readings and return if consistently above 130/80.  Type 2 diabetes mellitus Managed with metformin . - Continue metformin  as prescribed.  Hyperlipidemia Managed with atorvastatin . - Continue atorvastatin  as prescribed.      Follow up   Return in about 3 months (around 02/05/2024) for neck pain follow up in 4-6 weeks if not better.  __________________________________ Zada FREDRIK Palin, DNP, APRN, FNP-BC Primary Care and Sports Medicine [facility] [place]

## 2023-11-05 ENCOUNTER — Ambulatory Visit (INDEPENDENT_AMBULATORY_CARE_PROVIDER_SITE_OTHER): Admitting: Medical-Surgical

## 2023-11-05 ENCOUNTER — Encounter: Payer: Self-pay | Admitting: Medical-Surgical

## 2023-11-05 VITALS — BP 148/87 | HR 74 | Resp 20 | Ht 67.0 in | Wt 176.1 lb

## 2023-11-05 DIAGNOSIS — I10 Essential (primary) hypertension: Secondary | ICD-10-CM | POA: Insufficient documentation

## 2023-11-05 DIAGNOSIS — M542 Cervicalgia: Secondary | ICD-10-CM | POA: Diagnosis not present

## 2023-11-05 DIAGNOSIS — E785 Hyperlipidemia, unspecified: Secondary | ICD-10-CM | POA: Diagnosis not present

## 2023-11-05 DIAGNOSIS — R7303 Prediabetes: Secondary | ICD-10-CM

## 2023-11-05 MED ORDER — PREDNISONE 50 MG PO TABS: 50.0000 mg | ORAL_TABLET | Freq: Every day | ORAL | 0 refills | Status: DC

## 2023-11-16 ENCOUNTER — Ambulatory Visit: Admitting: Medical-Surgical

## 2023-12-29 NOTE — Progress Notes (Signed)
 Louis Bennett                                          MRN: [id number]   12/29/2023   The [facility] Quality Team Specialist reviewed this patient medical record for the purposes of chart review for care gap closure. The following were reviewed: chart review for care gap closure-colorectal cancer screening.    [facility] Quality Team

## 2024-01-18 ENCOUNTER — Other Ambulatory Visit: Payer: Self-pay | Admitting: Medical-Surgical

## 2024-01-18 DIAGNOSIS — R7303 Prediabetes: Secondary | ICD-10-CM

## 2024-01-26 ENCOUNTER — Other Ambulatory Visit: Payer: Self-pay | Admitting: Medical-Surgical

## 2024-01-26 DIAGNOSIS — M542 Cervicalgia: Secondary | ICD-10-CM

## 2024-01-28 ENCOUNTER — Telehealth: Payer: Self-pay

## 2024-01-28 NOTE — Telephone Encounter (Signed)
 Copied from CRM #[id number]. Topic: Appointments - Scheduling Inquiry for Clinic >> Jan 28, 2024 10:58 AM Delon HERO wrote: Reason for CRM: Patient is calling to see the the AWV can be moved 02/08/2024 closer to his 8:50a appointment with Zada, NP. Please advise

## 2024-01-28 NOTE — Telephone Encounter (Signed)
 Called patient left vm unable to do both appointments on same day will need to reschedule AWV to another day

## 2024-01-28 NOTE — Telephone Encounter (Signed)
 Can these be done on the same day and if so , would you please assist the patient in scheduling this?

## 2024-02-04 ENCOUNTER — Ambulatory Visit: Admitting: Urgent Care

## 2024-02-08 ENCOUNTER — Encounter: Payer: Self-pay | Admitting: Medical-Surgical

## 2024-02-08 ENCOUNTER — Ambulatory Visit (INDEPENDENT_AMBULATORY_CARE_PROVIDER_SITE_OTHER): Admitting: Medical-Surgical

## 2024-02-08 ENCOUNTER — Ambulatory Visit

## 2024-02-08 VITALS — BP 125/78 | HR 83 | Resp 20 | Ht 67.0 in | Wt 179.0 lb

## 2024-02-08 DIAGNOSIS — R7303 Prediabetes: Secondary | ICD-10-CM

## 2024-02-08 DIAGNOSIS — I10 Essential (primary) hypertension: Secondary | ICD-10-CM

## 2024-02-08 DIAGNOSIS — F419 Anxiety disorder, unspecified: Secondary | ICD-10-CM

## 2024-02-08 DIAGNOSIS — E782 Mixed hyperlipidemia: Secondary | ICD-10-CM

## 2024-02-08 DIAGNOSIS — Z23 Encounter for immunization: Secondary | ICD-10-CM | POA: Diagnosis not present

## 2024-02-08 LAB — POCT GLYCOSYLATED HEMOGLOBIN (HGB A1C): Hemoglobin A1C: 6.2 % — AB (ref 4.0–5.6)

## 2024-02-08 MED ORDER — METFORMIN HCL ER 500 MG PO TB24: 500.0000 mg | ORAL_TABLET | Freq: Every day | ORAL | 3 refills | Status: DC

## 2024-02-08 MED ORDER — CANDESARTAN CILEXETIL 8 MG PO TABS: 8.0000 mg | ORAL_TABLET | Freq: Every day | ORAL | 3 refills | Status: AC

## 2024-02-08 MED ORDER — ATORVASTATIN CALCIUM 20 MG PO TABS: 20.0000 mg | ORAL_TABLET | Freq: Every day | ORAL | 3 refills | Status: AC

## 2024-02-08 NOTE — Progress Notes (Signed)
 Established Patient Office Visit  Subjective   Patient ID: Louis Bennett, male    DOB: 12-18-1965  Age: 58 y.o. MRN: [id number]  No chief complaint on file.   HPI  58 year old male presents for follow up on meds  Pre-Diabetes Patient is currently taking metformin  XR 500 mg once a day. Denies having any side effects to his medications. He does not routinely check his blood sugars at home.  Hypertension Patient is currently taking candesartan 8 mg tablet daily. He is doing well with his medication. Reports elevated blood pressures at home averaging around 140s/80s. Reports some episodes of feeling lightheaded in the mornings. Denies chest pain, shortness of breath, heart palpitations.   Hyperlipidemia  Patient is currently taking atorvastatin  20 mg daily. He denies having any side effects to his medication.  Review of Systems  Constitutional: Negative.   HENT: Negative.    Eyes: Negative.   Respiratory: Negative.    Cardiovascular: Negative.   Gastrointestinal: Negative.   Genitourinary: Negative.   Musculoskeletal: Negative.   Skin: Negative.   Neurological: Negative.   Endo/Heme/Allergies: Negative.   Psychiatric/Behavioral: Negative.        Objective:     There were no vitals taken for this visit. BP Readings from Last 3 Encounters:  11/05/23 (!) 148/87  08/16/23 128/69  08/03/23 108/74   Wt Readings from Last 3 Encounters:  11/05/23 79.9 kg  08/16/23 79.4 kg  08/03/23 78.5 kg      Physical Exam Vitals and nursing note reviewed.  Constitutional:      General: He is not in acute distress.    Appearance: Normal appearance.  Cardiovascular:     Rate and Rhythm: Normal rate and regular rhythm.     Pulses: Normal pulses.     Heart sounds: Normal heart sounds.  Pulmonary:     Effort: Pulmonary effort is normal.     Breath sounds: Normal breath sounds.  Skin:    General: Skin is warm and dry.  Neurological:     General: No focal deficit present.      Mental Status: He is alert and oriented to person, place, and time.  Psychiatric:        Mood and Affect: Mood normal.        Behavior: Behavior normal.        Thought Content: Thought content normal.        Judgment: Judgment normal.      No results found for any visits on 02/08/24.  Last metabolic panel Lab Results  Component Value Date   GLUCOSE 89 01/11/2023   NA 140 01/11/2023   K 4.6 01/11/2023   CL 102 01/11/2023   CO2 24 01/11/2023   BUN 16 01/11/2023   CREATININE 1.35 (H) 01/11/2023   EGFR 60 01/11/2023   CALCIUM  9.7 01/11/2023   PROT 7.0 01/11/2023   ALBUMIN 4.4 01/11/2023   LABGLOB 2.6 01/11/2023   BILITOT 0.3 01/11/2023   ALKPHOS 67 01/11/2023   AST 18 01/11/2023   ALT 23 01/11/2023   Last lipids Lab Results  Component Value Date   CHOL 153 08/03/2022   HDL 52 08/03/2022   LDLCALC 87 08/03/2022   TRIG 62 08/03/2022   CHOLHDL 2.9 08/03/2022   Last hemoglobin A1c Lab Results  Component Value Date   HGBA1C 5.9 (A) 08/03/2023   HGBA1C 5.9 08/03/2023    The 10-year ASCVD risk score (Arnett DK, et al., 2019) is: 15.9%    Assessment & Plan:  1. Prediabetes (Primary) -Continue taking Metformin  XR 500 mg daily -Previous A1C 5.9% in May 2025 -A1C today was 6.2% -Discussed continued diet and exercise management  2. Mixed hyperlipidemia -Continue taking atorvastatin  20 mg daily -Recheck lipid panel today  3. Primary hypertension -Continue taking candesartan 8 mg daily -Recommended patient check BP when he has episodes of lightheadedness and or dizziness to rule out hypotension episodes -Discussed slow position changes and increasing hydration  -Recheck CMP -Refilled Candesartan 8 mg  4. Anxiety -Discussed elevated GAD 7 score today -Offered referral for [facility] and discusses treatment options -Patient declined at this time, and will follow up if needed  5. Need for influenza vaccination -Flu vaccine given today - Flu vaccine trivalent PF,  6mos and older(Flulaval,Afluria,Fluarix,Fluzone)    Return in about 6 months (around 08/07/2024) for Chronic Diseases.    Derrek JINNY Freund, NP Student

## 2024-02-08 NOTE — Progress Notes (Signed)
 Blood pressure elevated on arrival but after resting in a seated position during the appointment, recheck was 125/78, at goal. No changes in BP medications. Reviewed proper home blood pressure measurement technique.   Medical screening examination/treatment was performed by qualified clinical staff member and as supervising provider I was immediately available for consultation/collaboration. I have reviewed documentation and agree with assessment and plan.  Zada FREDRIK Palin, DNP, APRN, FNP-BC [facility] and Sports Medicine

## 2024-02-09 ENCOUNTER — Ambulatory Visit: Payer: Self-pay | Admitting: Medical-Surgical

## 2024-02-09 LAB — CMP14+EGFR
ALT: 26 IU/L (ref 0–44)
AST: 19 IU/L (ref 0–40)
Albumin: 4.6 g/dL (ref 3.8–4.9)
Alkaline Phosphatase: 54 IU/L (ref 47–123)
BUN/Creatinine Ratio: 13 (ref 10–24)
BUN: 17 mg/dL (ref 8–27)
Bilirubin Total: 0.3 mg/dL (ref 0.0–1.2)
CO2: 22 mmol/L (ref 20–29)
Calcium: 9.6 mg/dL (ref 8.6–10.2)
Chloride: 103 mmol/L (ref 96–106)
Creatinine, Ser: 1.26 mg/dL (ref 0.76–1.27)
Globulin, Total: 2.7 g/dL (ref 1.5–4.5)
Glucose: 85 mg/dL (ref 70–99)
Potassium: 5.2 mmol/L (ref 3.5–5.2)
Sodium: 140 mmol/L (ref 134–144)
Total Protein: 7.3 g/dL (ref 6.0–8.5)
eGFR: 65 mL/min/1.73 (ref >59)

## 2024-02-09 LAB — LIPID PANEL
Chol/HDL Ratio: 3 ratio (ref 0.0–5.0)
Cholesterol, Total: 160 mg/dL (ref 100–199)
HDL: 53 mg/dL (ref >39)
LDL Chol Calc (NIH): 89 mg/dL (ref 0–99)
Triglycerides: 98 mg/dL (ref 0–149)
VLDL Cholesterol Cal: 18 mg/dL (ref 5–40)

## 2024-02-11 ENCOUNTER — Other Ambulatory Visit: Payer: Self-pay | Admitting: Medical-Surgical

## 2024-02-11 DIAGNOSIS — E782 Mixed hyperlipidemia: Secondary | ICD-10-CM

## 2024-04-21 ENCOUNTER — Other Ambulatory Visit: Payer: Self-pay | Admitting: Medical-Surgical

## 2024-04-21 DIAGNOSIS — R7303 Prediabetes: Secondary | ICD-10-CM

## 2024-08-09 ENCOUNTER — Ambulatory Visit: Admitting: Medical-Surgical
